# Patient Record
Sex: Female | Born: 1955
Health system: Southern US, Community
[De-identification: ages and names within clinical notes are randomized; demographics above are authoritative.]

## PROBLEM LIST (undated history)

## (undated) DIAGNOSIS — I10 Essential (primary) hypertension: Secondary | ICD-10-CM

## (undated) DIAGNOSIS — R0789 Other chest pain: Secondary | ICD-10-CM

## (undated) DIAGNOSIS — E119 Type 2 diabetes mellitus without complications: Secondary | ICD-10-CM

## (undated) DIAGNOSIS — E7800 Pure hypercholesterolemia, unspecified: Secondary | ICD-10-CM

## (undated) HISTORY — DX: Other chest pain: R07.89

---

## 2008-01-16 ENCOUNTER — Other Ambulatory Visit: Admission: RE | Admit: 2008-01-16 | Discharge: 2008-01-16 | Payer: Self-pay | Admitting: Family Medicine

## 2008-12-28 ENCOUNTER — Other Ambulatory Visit: Admission: RE | Admit: 2008-12-28 | Discharge: 2008-12-28 | Payer: Self-pay | Admitting: Family Medicine

## 2009-01-20 ENCOUNTER — Encounter: Payer: Self-pay | Admitting: Neurosurgery

## 2009-09-22 ENCOUNTER — Encounter: Admission: RE | Admit: 2009-09-22 | Discharge: 2009-09-22 | Payer: Self-pay | Admitting: Family Medicine

## 2010-01-19 ENCOUNTER — Ambulatory Visit: Payer: Self-pay | Admitting: Family Medicine

## 2010-01-19 ENCOUNTER — Other Ambulatory Visit: Admission: RE | Admit: 2010-01-19 | Discharge: 2010-01-19 | Payer: Self-pay | Admitting: Family Medicine

## 2010-01-19 DIAGNOSIS — E669 Obesity, unspecified: Secondary | ICD-10-CM | POA: Insufficient documentation

## 2010-01-19 DIAGNOSIS — M899 Disorder of bone, unspecified: Secondary | ICD-10-CM | POA: Insufficient documentation

## 2010-01-19 DIAGNOSIS — E785 Hyperlipidemia, unspecified: Secondary | ICD-10-CM | POA: Insufficient documentation

## 2010-01-19 DIAGNOSIS — F17200 Nicotine dependence, unspecified, uncomplicated: Secondary | ICD-10-CM | POA: Insufficient documentation

## 2010-01-19 DIAGNOSIS — M949 Disorder of cartilage, unspecified: Secondary | ICD-10-CM

## 2010-01-19 LAB — CONVERTED CEMR LAB
ALT: 23 units/L (ref 0–35)
BUN: 11 mg/dL (ref 6–23)
Basophils Absolute: 0 10*3/uL (ref 0.0–0.1)
Bilirubin Urine: NEGATIVE
CO2: 32 meq/L (ref 19–32)
Chloride: 111 meq/L (ref 96–112)
Cholesterol: 187 mg/dL (ref 0–200)
Eosinophils Relative: 2.4 % (ref 0.0–5.0)
Glucose, Bld: 104 mg/dL — ABNORMAL HIGH (ref 70–99)
Glucose, Urine, Semiquant: NEGATIVE
HCT: 41.2 % (ref 36.0–46.0)
Ketones, urine, test strip: NEGATIVE
Lymphs Abs: 2.3 10*3/uL (ref 0.7–4.0)
MCHC: 33.4 g/dL (ref 30.0–36.0)
MCV: 92.7 fL (ref 78.0–100.0)
Monocytes Absolute: 0.6 10*3/uL (ref 0.1–1.0)
Pap Smear: NEGATIVE
Platelets: 227 10*3/uL (ref 150.0–400.0)
Potassium: 4.3 meq/L (ref 3.5–5.1)
RDW: 12.3 % (ref 11.5–14.6)
TSH: 1.5 microintl units/mL (ref 0.35–5.50)
Total Bilirubin: 0.7 mg/dL (ref 0.3–1.2)
pH: 5

## 2010-02-01 ENCOUNTER — Encounter (INDEPENDENT_AMBULATORY_CARE_PROVIDER_SITE_OTHER): Payer: Self-pay | Admitting: *Deleted

## 2010-02-02 ENCOUNTER — Ambulatory Visit: Payer: Self-pay | Admitting: Internal Medicine

## 2010-06-27 ENCOUNTER — Emergency Department (HOSPITAL_BASED_OUTPATIENT_CLINIC_OR_DEPARTMENT_OTHER): Admission: EM | Admit: 2010-06-27 | Discharge: 2010-06-27 | Payer: Self-pay | Admitting: Emergency Medicine

## 2010-06-27 ENCOUNTER — Emergency Department (HOSPITAL_BASED_OUTPATIENT_CLINIC_OR_DEPARTMENT_OTHER): Admission: EM | Admit: 2010-06-27 | Discharge: 2010-06-28 | Payer: Self-pay | Admitting: Emergency Medicine

## 2010-06-28 ENCOUNTER — Ambulatory Visit: Payer: Self-pay | Admitting: Diagnostic Radiology

## 2010-09-11 ENCOUNTER — Encounter: Admission: RE | Admit: 2010-09-11 | Discharge: 2010-09-11 | Payer: Self-pay | Admitting: Neurosurgery

## 2010-12-03 ENCOUNTER — Encounter: Payer: Self-pay | Admitting: Family Medicine

## 2010-12-03 ENCOUNTER — Encounter: Payer: Self-pay | Admitting: Neurosurgery

## 2010-12-12 NOTE — Assessment & Plan Note (Signed)
Summary: BRAND NEW PT/TO EST/CJR   Vital Signs:  Patient profile:   55 year old female Height:      62.5 inches Weight:      174 pounds BMI:     31.43 Temp:     98.8 degrees F oral Pulse rate:   72 / minute Pulse rhythm:   regular BP sitting:   122 / 80  (left arm) Cuff size:   large  Vitals Entered By: Alfred Levins, CMA (January 19, 2010 2:46 PM) CC: npx with pap, not fasting   CC:  npx with pap and not fasting.  History of Present Illness: Debra Morrison is a 55 year old, married female, smoker, who comes in today as a new patient for evaluation of multiple issues.  She has a history of hyperlipidemia and has been on Lipitor.  We will switch her to Zocor for cost issues.  She smoked for many years.  I explained to her that I insists all my patients stop smoking.  I will put her on a smoking cessation program.  She is a history of osteoporosis and has been on Fosamax for 3 years.  However, the review of her dated shows she really has osteopenia, not osteoporosis.  Would recommend she stop the Fosamax, get a follow-up bone density in two years maintaining good bone health with exercise, calcium and vitamin D, and stop smoking.  She takes Vicodin for back pain.  She's been seen and evaluated by her neurosurgeon, Dr. Theresa Duty.  She's had two epidural steroid injections.  They think she ultimately may need to have surgery.  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  She is also overweight.  No history of spinal trauma.  She's requesting pain medication.  Referred to her neurosurgeon to prescribe medication   Preventive Screening-Counseling & Management  Alcohol-Tobacco     Smoking Status: current  Caffeine-Diet-Exercise     Does Patient Exercise: no      Drug Use:  no.    Current Medications (verified): 1)  Hydrocodone-Acetaminophen 5-500 Mg Tabs (Hydrocodone-Acetaminophen) .Marland Kitchen.. 1 By Mouth Every 6 Hours As Needed For Pain  Allergies (verified): No Known Drug Allergies  Past  History:  Past Medical History: childbirth x 2 Hyperlipidemia tobacco abuse osteopenia  Past Surgical History: Liposuction Face lift Rhinoplasty PMH-FH-SH reviewed-no changes except otherwise noted  Family History: Reviewed history and no changes required. Father Alzheimer's disease Mother Hypertension Maternal Grandmother  MI  Social History: Married Current Smoker Alcohol use-no Drug use-no Regular exercise-no Smoking Status:  current Drug Use:  no Does Patient Exercise:  no  Review of Systems      See HPI  Physical Exam  General:  Well-developed,well-nourished,in no acute distress; alert,appropriate and cooperative throughout examination Head:  Normocephalic and atraumatic without obvious abnormalities. No apparent alopecia or balding. Eyes:  No corneal or conjunctival inflammation noted. EOMI. Perrla. Funduscopic exam benign, without hemorrhages, exudates or papilledema. Vision grossly normal. Ears:  External ear exam shows no significant lesions or deformities.  Otoscopic examination reveals clear canals, tympanic membranes are intact bilaterally without bulging, retraction, inflammation or discharge. Hearing is grossly normal bilaterally. Nose:  External nasal examination shows no deformity or inflammation. Nasal mucosa are pink and moist without lesions or exudates. Mouth:  Oral mucosa and oropharynx without lesions or exudates.  Teeth in good repair. Neck:  No deformities, masses, or tenderness noted. Chest Wall:  No deformities, masses, or tenderness noted. Breasts:  No mass, nodules, thickening, tenderness, bulging, retraction, inflamation, nipple discharge or  skin changes noted.   Lungs:  Normal respiratory effort, chest expands symmetrically. Lungs are clear to auscultation, no crackles or wheezes. Heart:  Normal rate and regular rhythm. S1 and S2 normal without gallop, murmur, click, rub or other extra sounds. Abdomen:  Bowel sounds positive,abdomen soft and  non-tender without masses, organomegaly or hernias noted. Rectal:  No external abnormalities noted. Normal sphincter tone. No rectal masses or tenderness. Genitalia:  Pelvic Exam:        External: normal female genitalia without lesions or masses        Vagina: normal without lesions or masses        Cervix: normal without lesions or masses        Adnexa: normal bimanual exam without masses or fullness        Uterus: normal by palpation        Pap smear: performed Msk:  No deformity or scoliosis noted of thoracic or lumbar spine.   Pulses:  R and L carotid,radial,femoral,dorsalis pedis and posterior tibial pulses are full and equal bilaterally Extremities:  No clubbing, cyanosis, edema, or deformity noted with normal full range of motion of all joints.   Neurologic:  No cranial nerve deficits noted. Station and gait are normal. Plantar reflexes are down-going bilaterally. DTRs are symmetrical throughout. Sensory, motor and coordinative functions appear intact. Skin:  Intact without suspicious lesions or rashes Cervical Nodes:  No lymphadenopathy noted Axillary Nodes:  No palpable lymphadenopathy Inguinal Nodes:  No significant adenopathy Psych:  Cognition and judgment appear intact. Alert and cooperative with normal attention span and concentration. No apparent delusions, illusions, hallucinations   Problems:  Medical Problems Added: 1)  Dx of Preventive Health Care  (ICD-V70.0) 2)  Dx of Osteopenia  (ICD-733.90) 3)  Dx of Obesity  (ICD-278.00) 4)  Dx of Tobacco Use  (ICD-305.1) 5)  Dx of Hyperlipidemia  (ICD-272.4)  Impression & Recommendations:  Problem # 1:  HYPERLIPIDEMIA (ICD-272.4) Assessment New  Her updated medication list for this problem includes:    Zocor 40 Mg Tabs (Simvastatin) .Marland Kitchen... 1 tab @ bedtime  Orders: Venipuncture ([address]) TLB-Lipid Panel (80061-LIPID) TLB-BMP (Basic Metabolic Panel-BMET) (80048-METABOL) TLB-CBC Platelet - w/Differential  (85025-CBCD) TLB-Hepatic/Liver Function Pnl (80076-HEPATIC) TLB-TSH (Thyroid Stimulating Hormone) (84443-TSH)  Problem # 2:  OSTEOPENIA (ICD-733.90) Assessment: New  Orders: Venipuncture ([address]) TLB-Lipid Panel (80061-LIPID) TLB-BMP (Basic Metabolic Panel-BMET) (80048-METABOL) TLB-CBC Platelet - w/Differential (85025-CBCD) TLB-Hepatic/Liver Function Pnl (80076-HEPATIC) TLB-TSH (Thyroid Stimulating Hormone) (84443-TSH)  Problem # 3:  TOBACCO USE (ICD-305.1) Assessment: New  Her updated medication list for this problem includes:    Chantix Starting Month Pak 0.5 Mg X 11 & 1 Mg X 42 Tabs (Varenicline tartrate) ..... Uad  Problem # 4:  OBESITY (ICD-278.00) Assessment: New  Orders: Venipuncture ([address]11) TLB-Lipid Panel (80061-LIPID) TLB-BMP (Basic Metabolic Panel-BMET) (80048-METABOL) TLB-CBC Platelet - w/Differential (85025-CBCD) TLB-Hepatic/Liver Function Pnl (80076-HEPATIC) TLB-TSH (Thyroid Stimulating Hormone) (84443-TSH)  Complete Medication List: 1)  Hydrocodone-acetaminophen 5-500 Mg Tabs (Hydrocodone-acetaminophen) .Marland Kitchen.. 1 by mouth every 6 hours as needed for pain 2)  Zocor 40 Mg Tabs (Simvastatin) .Marland Kitchen.. 1 tab @ bedtime 3)  Chantix Starting Month Pak 0.5 Mg X 11 & 1 Mg X 42 Tabs (Varenicline tartrate) .... Uad  Other Orders: UA Dipstick w/o Micro (automated)  (81003) Gastroenterology Referral (GI)  Patient Instructions: 1)  switched from Lipitor to Zocor 40 mg nightly along with an 81 mg baby aspirin. 2)  Begin the smoking cessation program, and we outlined return in 4 weeks for follow-up. 3)  Stop  the Fosamax.  Take calcium, vitamin D, and exercise to maintain bone health follow-up.  Bone density in two years 4)  Schedule a colonoscopy/sigmoidoscopy to help detect colon cancer. 5)  Take calcium +Vitamin D daily. 6)  Take an Aspirin every day. Prescriptions: CHANTIX STARTING MONTH PAK 0.5 MG X 11 & 1 MG X 42 TABS (VARENICLINE TARTRATE) UAD  #1 x 0   Entered and  Authorized by:   Roderick Pee MD   Signed by:   Roderick Pee MD on 01/19/2010   Method used:   Print then Give to Patient   RxID:   [id number] ZOCOR 40 MG TABS (SIMVASTATIN) 1 tab @ bedtime  #100 x 3   Entered and Authorized by:   Roderick Pee MD   Signed by:   Roderick Pee MD on 01/19/2010   Method used:   Print then Give to Patient   RxID:   [id number]    Immunization History:  Tetanus/Td Immunization History:    Tetanus/Td:  historical (11/12/2006)   Laboratory Results   Urine Tests  Date/Time Recieved: January 20, 2010 11:51 AM  Date/Time Reported: January 20, 2010 11:51 AM   Routine Urinalysis   Color: yellow Appearance: Clear Glucose: negative   (Normal Range: Negative) Bilirubin: negative   (Normal Range: Negative) Ketone: negative   (Normal Range: Negative) Spec. Gravity: >=1.030   (Normal Range: 1.003-1.035) Blood: negative   (Normal Range: Negative) pH: 5.0   (Normal Range: 5.0-8.0) Protein: negative   (Normal Range: Negative) Urobilinogen: 0.2   (Normal Range: 0-1) Nitrite: negative   (Normal Range: Negative) Leukocyte Esterace: negative   (Normal Range: Negative)    Comments: Wynona Canes, CMA  January 20, 2010 11:51 AM

## 2010-12-12 NOTE — Letter (Signed)
Summary: [facility] Instructions  [facility]  [address] [place], [place] [address]   Phone: [phone]  Fax: [phone]       Debra Morrison    06/26/1956    MRN: [id number]       Procedure Day Debra Morrison:  Debra Morrison  Morrison     Arrival Time:  10:00AM     Procedure Time:  11:00AM     Location of Procedure:                    _ X_  [facility] (4th Floor)       PREPARATION FOR COLONOSCOPY WITH MIRALAX  Starting 5 days prior to your procedure 02/11/10 do not eat nuts, seeds, popcorn, corn, beans, peas,  salads, or any raw vegetables.  Do not take any fiber supplements (e.g. Metamucil, Citrucel, and Benefiber). ____________________________________________________________________________________________________   THE DAY BEFORE YOUR PROCEDURE         DATE:02/15/10  DAY: WEDNESDAY  1   Drink clear liquids the entire day-NO SOLID FOOD  2   Do not drink anything colored red or purple.  Avoid juices with pulp.  No orange juice.  3   Drink at least 64 oz. (8 glasses) of fluid/clear liquids during the day to prevent dehydration and help the prep work efficiently.  CLEAR LIQUIDS INCLUDE: Water Jello Ice Popsicles Tea (sugar ok, no milk/cream) Powdered fruit flavored drinks Coffee (sugar ok, no milk/cream) Gatorade Juice: apple, white grape, white cranberry  Lemonade Clear bullion, consomm, broth Carbonated beverages (any kind) Strained chicken noodle soup Hard Candy  4   Mix the entire bottle of Miralax with 64 oz. of Gatorade/Powerade in the morning and put in the refrigerator to chill.  5   At 3:00 pm take 2 Dulcolax/Bisacodyl tablets.  6   At 4:30 pm take one Reglan/Metoclopramide tablet.  7  Starting at 5:00 pm drink one 8 oz glass of the Miralax mixture every 15-20 minutes until you have finished drinking the entire 64 oz.  You should finish drinking prep around 7:30 or 8:00 pm.  8   If you are nauseated, you may take the 2nd  Reglan/Metoclopramide tablet at 6:30 pm.        9    At 8:00 pm take 2 more DULCOLAX/Bisacodyl tablets.     THE DAY OF YOUR PROCEDURE      DATE:  Morrison  DAY: Debra Morrison  You may drink clear liquids until 9:00AM  (2 HOURS BEFORE PROCEDURE).   MEDICATION INSTRUCTIONS  Unless otherwise instructed, you should take regular prescription medications with a small sip of water as early as possible the morning of your procedure.         OTHER INSTRUCTIONS  You will need a responsible adult at least 54 years of age to accompany you and drive you home.   This person must remain in the waiting room during your procedure.  Wear loose fitting clothing that is easily removed.  Leave jewelry and other valuables at home.  However, you may wish to bring a book to read or an iPod/MP3 player to listen to music as you wait for your procedure to start.  Remove all body piercing jewelry and leave at home.  Total time from sign-in until discharge is approximately 2-3 hours.  You should go home directly after your procedure and rest.  You can resume normal activities the day after your procedure.  The day of your procedure you should not:  Drive   Make legal decisions   Operate machinery   Drink alcohol   Return to work  You will receive specific instructions about eating, activities and medications before you leave.   The above instructions have been reviewed and explained to me by   Karl Bales RN  February 02, 2010 2:29 PM     I fully understand and can verbalize these instructions _____________________________ Date _______

## 2010-12-12 NOTE — Miscellaneous (Signed)
Summary: [facility] Previsit  Clinical Lists Changes  Medications: Added new medication of MIRALAX   POWD (POLYETHYLENE GLYCOL 3350) As per prep  instructions. - Signed Added new medication of DULCOLAX 5 MG  TBEC (BISACODYL) Day before procedure take 2 at 3pm and 2 at 8pm. - Signed Added new medication of REGLAN 10 MG  TABS (METOCLOPRAMIDE HCL) As per prep instructions. - Signed Rx of MIRALAX   POWD (POLYETHYLENE GLYCOL 3350) As per prep  instructions.;  #255gm x 0;  Signed;  Entered by: Karl Bales RN;  Authorized by: Hart Carwin MD;  Method used: Electronically to [id number] [address], [place], [place]  [address], Ph: [phone] or [phone], Fax: [phone] Rx of DULCOLAX 5 MG  TBEC (BISACODYL) Day before procedure take 2 at 3pm and 2 at 8pm.;  #4 x 0;  Signed;  Entered by: Karl Bales RN;  Authorized by: Hart Carwin MD;  Method used: Electronically to [facility] [address] [place], [place]  [address], Ph: [phone] or [phone], Fax: [phone] Rx of REGLAN 10 MG  TABS (METOCLOPRAMIDE HCL) As per prep instructions.;  #2 x 0;  Signed;  Entered by: Karl Bales RN;  Authorized by: Hart Carwin MD;  Method used: Electronically to [id number] [address] [place], [place]  [address], Ph: [phone] or [phone], Fax: [phone] Observations: Added new observation of NKA: T (02/02/2010 14:07)    Prescriptions: REGLAN 10 MG  TABS (METOCLOPRAMIDE HCL) As per prep instructions.  #2 x 0   Entered by:   Karl Bales RN   Authorized by:   Hart Carwin MD   Signed by:   Karl Bales RN on 02/02/2010   Method used:   Electronically to        [facility] (retail)       [address]       [place], [place]  [address]       Ph: [phone] or [phone]       Fax: [phone]   RxID:   [id number] DULCOLAX 5 MG  TBEC (BISACODYL) Day before procedure take 2 at 3pm and 2 at 8pm.  #4 x 0   Entered by:   Karl Bales RN  Authorized by:   Hart Carwin MD   Signed by:   Karl Bales RN on 02/02/2010   Method used:   Electronically to        [facility] (retail)       [address]       [place], [place]  [address]       Ph: [phone] or [phone]       Fax: [phone]   RxID:   [id number] MIRALAX   POWD (POLYETHYLENE GLYCOL 3350) As per prep  instructions.  #255gm x 0   Entered by:   Karl Bales RN   Authorized by:   Hart Carwin MD   Signed by:   Karl Bales RN on 02/02/2010   Method used:   Electronically to        [facility] (retail)       [address]       [place], [place]  [address]       Ph: [phone] or [phone]       Fax: [phone]   RxID:   [id number]

## 2011-01-26 LAB — POCT CARDIAC MARKERS
CKMB, poc: 1 ng/mL (ref 1.0–8.0)
CKMB, poc: 1.3 ng/mL (ref 1.0–8.0)
Myoglobin, poc: 46.9 ng/mL (ref 12–200)
Myoglobin, poc: 48.4 ng/mL (ref 12–200)
Troponin i, poc: <0.05 ng/mL (ref 0.00–0.09)

## 2011-01-26 LAB — URINALYSIS, ROUTINE W REFLEX MICROSCOPIC
Glucose, UA: NEGATIVE mg/dL
Ketones, ur: NEGATIVE mg/dL
Nitrite: NEGATIVE
pH: 7 (ref 5.0–8.0)

## 2011-01-26 LAB — DIFFERENTIAL
Basophils Absolute: 0.1 10*3/uL (ref 0.0–0.1)
Lymphocytes Relative: 25 % (ref 12–46)
Neutro Abs: 6.2 10*3/uL (ref 1.7–7.7)

## 2011-01-26 LAB — CBC
HCT: 42 % (ref 36.0–46.0)
Platelets: 246 10*3/uL (ref 150–400)
RDW: 12.5 % (ref 11.5–15.5)
WBC: 10 10*3/uL (ref 4.0–10.5)

## 2011-01-26 LAB — BASIC METABOLIC PANEL
BUN: 17 mg/dL (ref 6–23)
GFR calc non Af Amer: >60 mL/min (ref >60)
Potassium: 3.9 mEq/L (ref 3.5–5.1)

## 2011-01-26 LAB — D-DIMER, QUANTITATIVE: D-Dimer, Quant: <0.22 ug/mL-FEU (ref 0.00–0.48)

## 2011-10-25 ENCOUNTER — Other Ambulatory Visit (HOSPITAL_COMMUNITY)
Admission: RE | Admit: 2011-10-25 | Discharge: 2011-10-25 | Disposition: A | Discharge status: Home / Self Care | Payer: Medicare Other | Source: Ambulatory Visit | Attending: Family Medicine | Admitting: Family Medicine

## 2011-10-25 ENCOUNTER — Other Ambulatory Visit: Payer: Self-pay | Admitting: Family Medicine

## 2011-10-25 DIAGNOSIS — Z124 Encounter for screening for malignant neoplasm of cervix: Secondary | ICD-10-CM | POA: Insufficient documentation

## 2011-10-25 DIAGNOSIS — Z1159 Encounter for screening for other viral diseases: Secondary | ICD-10-CM | POA: Insufficient documentation

## 2012-01-17 DIAGNOSIS — Z1231 Encounter for screening mammogram for malignant neoplasm of breast: Secondary | ICD-10-CM | POA: Diagnosis not present

## 2012-01-17 DIAGNOSIS — E559 Vitamin D deficiency, unspecified: Secondary | ICD-10-CM | POA: Diagnosis not present

## 2012-01-17 DIAGNOSIS — E785 Hyperlipidemia, unspecified: Secondary | ICD-10-CM | POA: Diagnosis not present

## 2012-01-25 DIAGNOSIS — M722 Plantar fascial fibromatosis: Secondary | ICD-10-CM | POA: Diagnosis not present

## 2012-08-21 DIAGNOSIS — E559 Vitamin D deficiency, unspecified: Secondary | ICD-10-CM | POA: Diagnosis not present

## 2012-09-15 DIAGNOSIS — J309 Allergic rhinitis, unspecified: Secondary | ICD-10-CM | POA: Diagnosis not present

## 2012-09-15 DIAGNOSIS — R079 Chest pain, unspecified: Secondary | ICD-10-CM | POA: Diagnosis not present

## 2012-09-24 DIAGNOSIS — R0602 Shortness of breath: Secondary | ICD-10-CM | POA: Diagnosis not present

## 2012-09-24 DIAGNOSIS — I10 Essential (primary) hypertension: Secondary | ICD-10-CM | POA: Diagnosis not present

## 2012-09-24 DIAGNOSIS — F17200 Nicotine dependence, unspecified, uncomplicated: Secondary | ICD-10-CM | POA: Diagnosis not present

## 2012-09-24 DIAGNOSIS — R079 Chest pain, unspecified: Secondary | ICD-10-CM | POA: Diagnosis not present

## 2012-10-01 DIAGNOSIS — R0602 Shortness of breath: Secondary | ICD-10-CM | POA: Diagnosis not present

## 2012-10-01 DIAGNOSIS — R079 Chest pain, unspecified: Secondary | ICD-10-CM | POA: Diagnosis not present

## 2012-10-01 DIAGNOSIS — I10 Essential (primary) hypertension: Secondary | ICD-10-CM | POA: Diagnosis not present

## 2012-10-01 DIAGNOSIS — F17200 Nicotine dependence, unspecified, uncomplicated: Secondary | ICD-10-CM | POA: Diagnosis not present

## 2013-01-01 DIAGNOSIS — M545 Low back pain, unspecified: Secondary | ICD-10-CM | POA: Diagnosis not present

## 2013-01-01 DIAGNOSIS — N951 Menopausal and female climacteric states: Secondary | ICD-10-CM | POA: Diagnosis not present

## 2013-01-01 DIAGNOSIS — E559 Vitamin D deficiency, unspecified: Secondary | ICD-10-CM | POA: Diagnosis not present

## 2013-01-01 DIAGNOSIS — Z0000 Encounter for general adult medical examination without abnormal findings: Secondary | ICD-10-CM | POA: Diagnosis not present

## 2013-01-01 DIAGNOSIS — Z79899 Other long term (current) drug therapy: Secondary | ICD-10-CM | POA: Diagnosis not present

## 2013-01-01 DIAGNOSIS — IMO0002 Reserved for concepts with insufficient information to code with codable children: Secondary | ICD-10-CM | POA: Diagnosis not present

## 2013-01-01 DIAGNOSIS — E7800 Pure hypercholesterolemia, unspecified: Secondary | ICD-10-CM | POA: Diagnosis not present

## 2013-01-01 DIAGNOSIS — F17200 Nicotine dependence, unspecified, uncomplicated: Secondary | ICD-10-CM | POA: Diagnosis not present

## 2013-06-18 DIAGNOSIS — J069 Acute upper respiratory infection, unspecified: Secondary | ICD-10-CM | POA: Diagnosis not present

## 2013-06-18 DIAGNOSIS — M654 Radial styloid tenosynovitis [de Quervain]: Secondary | ICD-10-CM | POA: Diagnosis not present

## 2013-07-06 DIAGNOSIS — M5106 Intervertebral disc disorders with myelopathy, lumbar region: Secondary | ICD-10-CM | POA: Diagnosis not present

## 2013-07-06 DIAGNOSIS — M654 Radial styloid tenosynovitis [de Quervain]: Secondary | ICD-10-CM | POA: Diagnosis not present

## 2013-10-13 DIAGNOSIS — M654 Radial styloid tenosynovitis [de Quervain]: Secondary | ICD-10-CM | POA: Diagnosis not present

## 2013-11-24 ENCOUNTER — Emergency Department (HOSPITAL_COMMUNITY)
Admission: EM | Admit: 2013-11-24 | Discharge: 2013-11-24 | Disposition: A | Discharge status: Home / Self Care | Payer: Medicare Other | Attending: Emergency Medicine | Admitting: Emergency Medicine

## 2013-11-24 ENCOUNTER — Emergency Department (HOSPITAL_COMMUNITY): Payer: Medicare Other

## 2013-11-24 ENCOUNTER — Encounter (HOSPITAL_COMMUNITY): Payer: Self-pay | Admitting: Emergency Medicine

## 2013-11-24 DIAGNOSIS — R6889 Other general symptoms and signs: Secondary | ICD-10-CM

## 2013-11-24 DIAGNOSIS — F17200 Nicotine dependence, unspecified, uncomplicated: Secondary | ICD-10-CM | POA: Insufficient documentation

## 2013-11-24 DIAGNOSIS — J111 Influenza due to unidentified influenza virus with other respiratory manifestations: Secondary | ICD-10-CM | POA: Insufficient documentation

## 2013-11-24 DIAGNOSIS — R059 Cough, unspecified: Secondary | ICD-10-CM | POA: Diagnosis not present

## 2013-11-24 DIAGNOSIS — R05 Cough: Secondary | ICD-10-CM | POA: Diagnosis not present

## 2013-11-24 DIAGNOSIS — R062 Wheezing: Secondary | ICD-10-CM | POA: Diagnosis not present

## 2013-11-24 DIAGNOSIS — Z792 Long term (current) use of antibiotics: Secondary | ICD-10-CM | POA: Diagnosis not present

## 2013-11-24 MED ORDER — IBUPROFEN 800 MG PO TABS: 800.0000 mg | ORAL_TABLET | Freq: Three times a day (TID) | ORAL | Status: DC

## 2013-11-24 MED ORDER — HYDROCOD POLST-CHLORPHEN POLST 10-8 MG/5ML PO LQCR
5.0000 mL | Freq: Once | ORAL | Status: AC
  Administered 2013-11-24: 5 mL via ORAL
  Filled 2013-11-24: qty 5

## 2013-11-24 MED ORDER — PROMETHAZINE-DM 6.25-15 MG/5ML PO SYRP: 5.0000 mL | ORAL_SOLUTION | Freq: Four times a day (QID) | ORAL | Status: DC | PRN

## 2013-11-24 MED ORDER — PSEUDOEPHEDRINE HCL 30 MG PO TABS: 30.0000 mg | ORAL_TABLET | ORAL | Status: DC | PRN

## 2013-11-24 MED ORDER — ALBUTEROL SULFATE HFA 108 (90 BASE) MCG/ACT IN AERS
2.0000 | INHALATION_SPRAY | Freq: Once | RESPIRATORY_TRACT | Status: AC
  Administered 2013-11-24: 2 via RESPIRATORY_TRACT
  Filled 2013-11-24: qty 6.7

## 2013-11-24 MED ORDER — IBUPROFEN 800 MG PO TABS
800.0000 mg | ORAL_TABLET | Freq: Once | ORAL | Status: AC
  Administered 2013-11-24: 800 mg via ORAL
  Filled 2013-11-24: qty 1

## 2013-11-24 NOTE — Discharge Instructions (Signed)
Take ibuprofen regularly to keep your temperature down. You can continue dayquil and Nyquil as long as they do not contain dextromethorphan. Take promathazine DM for cough, this medication will make you drowsy and help you sleep. Take inhaler 2 puffs every 4 hrs.  Follow up with primary care doctor.    Influenza A (H1N1) H1N1 formerly called "swine flu" is a new influenza virus causing sickness in people. The H1N1 virus is different from seasonal influenza viruses. However, the H1N1 symptoms are similar to seasonal influenza and it is spread from person to person. You may be at higher risk for serious problems if you have underlying serious medical conditions. The CDC and the [facility] are following reported cases around the world. CAUSES   The flu is thought to spread mainly person-to-person through coughing or sneezing of infected people.  A person may become infected by touching something with the virus on it and then touching their mouth or nose. SYMPTOMS   Fever.  Headache.  Tiredness.  Cough.  Sore throat.  Runny or stuffy nose.  Body aches.  Diarrhea and vomiting These symptoms are referred to as "flu-like symptoms." A lot of different illnesses, including the common cold, may have similar symptoms. DIAGNOSIS   There are tests that can tell if you have the H1N1 virus.  Confirmed cases of H1N1 will be reported to the state or local health department.  A doctor's exam may be needed to tell whether you have an infection that is a complication of the flu. HOME CARE INSTRUCTIONS   Stay informed. Visit the [facility] website for current recommendations. Visit [employer]. You may also call 1-800-CDC-INFO 2013963715).  Get help early if you develop any of the above symptoms.  If you are at high risk from complications of the flu, talk to your caregiver as soon as you develop flu-like symptoms. Those at higher risk for complications include:  People 65  years or older.  People with chronic medical conditions.  Pregnant women.  Young children.  Your caregiver may recommend antiviral medicine to help treat the flu.  If you get the flu, get plenty of rest, drink enough water and fluids to keep your urine clear or pale yellow, and avoid using alcohol or tobacco.  You may take over-the-counter medicine to relieve the symptoms of the flu if your caregiver approves. (Never give aspirin to children or teenagers who have flu-like symptoms, particularly fever). TREATMENT  If you do get sick, antiviral drugs are available. These drugs can make your illness milder and make you feel better faster. Treatment should start soon after illness starts. It is only effective if taken within the first day of becoming ill. Only your caregiver can prescribe antiviral medication.  PREVENTION   Cover your nose and mouth with a tissue or your arm when you cough or sneeze. Throw the tissue away.  Wash your hands often with soap and warm water, especially after you cough or sneeze. Alcohol-based cleaners are also effective against germs.  Avoid touching your eyes, nose or mouth. This is one way germs spread.  Try to avoid contact with sick people. Follow public health advice regarding school closures. Avoid crowds.  Stay home if you get sick. Limit contact with others to keep from infecting them. People infected with the H1N1 virus may be able to infect others anywhere from 1 day before feeling sick to 5-7 days after getting flu symptoms.  An H1N1 vaccine is available to help protect against the virus.  In addition to the H1N1 vaccine, you will need to be vaccinated for seasonal influenza. The H1N1 and seasonal vaccines may be given on the same day. The CDC especially recommends the H1N1 vaccine for:  Pregnant women.  People who live with or care for children younger than 32 months of age.  Health care and emergency services personnel.  Persons between the ages  of 22 months through 65 years of age.  People from ages 37 through 52 years who are at higher risk for H1N1 because of chronic health disorders or immune system problems. FACEMASKS In community and home settings, the use of facemasks and N95 respirators are not normally recommended. In certain circumstances, a facemask or N95 respirator may be used for persons at increased risk of severe illness from influenza. Your caregiver can give additional recommendations for facemask use. IN CHILDREN, EMERGENCY WARNING SIGNS THAT NEED URGENT MEDICAL CARE:  Fast breathing or trouble breathing.  Bluish skin color.  Not drinking enough fluids.  Not waking up or not interacting normally.  Being so fussy that the child does not want to be held.  Your child has an oral temperature above 102 F (38.9 C), not controlled by medicine.  Your baby is older than 3 months with a rectal temperature of 102 F (38.9 C) or higher.  Your baby is 69 months old or younger with a rectal temperature of 100.4 F (38 C) or higher.  Flu-like symptoms improve but then return with fever and worse cough. IN ADULTS, EMERGENCY WARNING SIGNS THAT NEED URGENT MEDICAL CARE:  Difficulty breathing or shortness of breath.  Pain or pressure in the chest or abdomen.  Sudden dizziness.  Confusion.  Severe or persistent vomiting.  Bluish color.  You have a oral temperature above 102 F (38.9 C), not controlled by medicine.  Flu-like symptoms improve but return with fever and worse cough. SEEK IMMEDIATE MEDICAL CARE IF:  You or someone you know is experiencing any of the above symptoms. When you arrive at the emergency center, report that you think you have the flu. You may be asked to wear a mask and/or sit in a secluded area to protect others from getting sick. MAKE SURE YOU:   Understand these instructions.  Will watch your condition.  Will get help right away if you are not doing well or get worse. Some of this  information courtesy of the CDC.  Document Released: 04/16/2008 Document Revised: 01/21/2012 Document Reviewed: 04/16/2008 [facility] Patient Information 2014 [place], [place].

## 2013-11-24 NOTE — ED Provider Notes (Signed)
CSN: [id number]     Arrival date & time 11/24/13  1403 History   First MD Initiated Contact with Patient 11/24/13 1539     Chief Complaint  Patient presents with  . Cough  . Fever  . Generalized Body Aches   (Consider location/radiation/quality/duration/timing/severity/associated sxs/prior Treatment) HPI Debra Morrison is a 58 y.o. female who presents to emergency department complaining of fever, body aches, chills, sore throat, congestion, cough. Symptoms began 4 days ago. She's been taking over-the-counter indications for her symptoms with no relief. Patient also states she took several tablets of Zithromax that she has had left over since August. She states they're not helping either. Patient states that her husband is at home with similar symptoms. Patient did not receive her flu vaccination this year. Patient denies any chest pain or shortness of breath. Patient denies any nausea, vomiting, diarrhea, abdominal pain. She denies any urinary symptoms.  History reviewed. No pertinent past medical history. No past surgical history on file. No family history on file. History  Substance Use Topics  . Smoking status: Current Every Day Smoker -- 0.50 packs/day    Types: Cigarettes  . Smokeless tobacco: Not on file  . Alcohol Use: No   OB History   Grav Para Term Preterm Abortions TAB SAB Ect Mult Living                 Review of Systems  Constitutional: Positive for fever and chills.  HENT: Positive for congestion and sore throat. Negative for ear pain.   Respiratory: Positive for cough. Negative for chest tightness and shortness of breath.   Cardiovascular: Negative for chest pain, palpitations and leg swelling.  Gastrointestinal: Negative for nausea, vomiting, abdominal pain and diarrhea.  Genitourinary: Negative for dysuria, flank pain, vaginal bleeding, vaginal discharge, vaginal pain and pelvic pain.  Musculoskeletal: Positive for myalgias. Negative for arthralgias, neck pain and  neck stiffness.  Skin: Negative for rash.  Neurological: Negative for dizziness, weakness and headaches.  All other systems reviewed and are negative.    Allergies  Boniva  Home Medications   Current Outpatient Rx  Name  Route  Sig  Dispense  Refill  . azithromycin (ZITHROMAX) 250 MG tablet   Oral   Take 250 mg by mouth daily. Take two tablets first day and then 250mg  daily till finished five day supply. Started on 11/22/13          BP 121/76  Pulse 94  Temp(Src) 98.3 F (36.8 C) (Oral)  Resp 18  SpO2 99% Physical Exam  Nursing note and vitals reviewed. Constitutional: She appears well-developed and well-nourished. No distress.  HENT:  Head: Normocephalic.  Right Ear: External ear normal.  Left Ear: External ear normal.  Nasal congestion. Oropharynx erythematous. Tonsils normal. Uvula midline.   Eyes: Conjunctivae are normal.  Neck: Neck supple.  Cardiovascular: Normal rate, regular rhythm and normal heart sounds.   Pulmonary/Chest: Effort normal. No respiratory distress. She has wheezes. She has no rales.  Diffuse wheezes with expirations. coughing  Abdominal: Soft. Bowel sounds are normal. She exhibits no distension. There is no tenderness. There is no rebound.  Musculoskeletal: She exhibits no edema.  Neurological: She is alert.  Skin: Skin is warm and dry.  Psychiatric: She has a normal mood and affect. Her behavior is normal.    ED Course  Procedures (including critical care time) Labs Review Labs Reviewed - No data to display Imaging Review Dg Chest 2 View  11/24/2013   CLINICAL DATA:  Cough.  Fever.  EXAM: CHEST  2 VIEW  COMPARISON:  06/27/2010  FINDINGS: The heart size and mediastinal contours are within normal limits. Both lungs are clear. The visualized skeletal structures are unremarkable.  IMPRESSION: No active cardiopulmonary disease.   Electronically Signed   By: Lajean Manes M.D.   On: 11/24/2013 16:21    EKG Interpretation   None       MDM    1. Flu-like symptoms     Pt with Flu like symptoms for 4 days. Worsening. Husband sick at home with the same. Pt febrile in er at 100.3. She has sore throat, nasal congestion, cough. CXR was obtained and is negative. Pt was treated with ibuprofen 800mg  for her fever, inhaler for wheezing, which she took home, and tussionex for cough. Will discharge home with cough medication, ibuprofen, sudafed. Follow up with pcp advised.   Filed Vitals:   11/24/13 1427 11/24/13 1622  BP: 121/76   Pulse: 94   Temp: 98.3 F (36.8 C) 100.3 F (37.9 C)  TempSrc: Oral Oral  Resp: 18   SpO2: 99%        Chetara Kropp A Jowanda Heeg, PA-C 11/24/13 2100

## 2013-11-24 NOTE — ED Notes (Signed)
Pt c/o fever, cough, body aches x 4 days.

## 2013-11-25 NOTE — ED Provider Notes (Signed)
Medical screening examination/treatment/procedure(s) were conducted as a shared visit with non-physician practitioner(s) and myself.  I personally evaluated the patient during the encounter.  EKG Interpretation   None       Pt c/o non prod cough, body aches, nasal congestion. Chest cta. Non prod cough. cxr.  Mirna Mires, MD 11/25/13 [phone]

## 2014-01-04 ENCOUNTER — Emergency Department (HOSPITAL_COMMUNITY)
Admission: EM | Admit: 2014-01-04 | Discharge: 2014-01-04 | Disposition: A | Discharge status: Home / Self Care | Payer: Medicare Other | Attending: Emergency Medicine | Admitting: Emergency Medicine

## 2014-01-04 ENCOUNTER — Emergency Department (HOSPITAL_COMMUNITY): Payer: Medicare Other

## 2014-01-04 ENCOUNTER — Encounter (HOSPITAL_COMMUNITY): Payer: Self-pay | Admitting: Emergency Medicine

## 2014-01-04 DIAGNOSIS — R112 Nausea with vomiting, unspecified: Secondary | ICD-10-CM | POA: Diagnosis not present

## 2014-01-04 DIAGNOSIS — Z8639 Personal history of other endocrine, nutritional and metabolic disease: Secondary | ICD-10-CM | POA: Insufficient documentation

## 2014-01-04 DIAGNOSIS — Z862 Personal history of diseases of the blood and blood-forming organs and certain disorders involving the immune mechanism: Secondary | ICD-10-CM | POA: Diagnosis not present

## 2014-01-04 DIAGNOSIS — R1013 Epigastric pain: Secondary | ICD-10-CM

## 2014-01-04 DIAGNOSIS — N289 Disorder of kidney and ureter, unspecified: Secondary | ICD-10-CM | POA: Diagnosis not present

## 2014-01-04 DIAGNOSIS — N281 Cyst of kidney, acquired: Secondary | ICD-10-CM

## 2014-01-04 DIAGNOSIS — R109 Unspecified abdominal pain: Secondary | ICD-10-CM | POA: Diagnosis not present

## 2014-01-04 DIAGNOSIS — F17200 Nicotine dependence, unspecified, uncomplicated: Secondary | ICD-10-CM | POA: Insufficient documentation

## 2014-01-04 DIAGNOSIS — Q619 Cystic kidney disease, unspecified: Secondary | ICD-10-CM | POA: Diagnosis not present

## 2014-01-04 HISTORY — DX: Pure hypercholesterolemia, unspecified: E78.00

## 2014-01-04 LAB — URINALYSIS, ROUTINE W REFLEX MICROSCOPIC
Bilirubin Urine: NEGATIVE
Glucose, UA: NEGATIVE mg/dL
HGB URINE DIPSTICK: NEGATIVE
Ketones, ur: NEGATIVE mg/dL
LEUKOCYTES UA: NEGATIVE
NITRITE: NEGATIVE
PROTEIN: NEGATIVE mg/dL
SPECIFIC GRAVITY, URINE: 1.008 (ref 1.005–1.030)
UROBILINOGEN UA: 0.2 mg/dL (ref 0.0–1.0)
pH: 7 (ref 5.0–8.0)

## 2014-01-04 LAB — COMPREHENSIVE METABOLIC PANEL
ALT: 22 U/L (ref 0–35)
AST: 18 U/L (ref 0–37)
Albumin: 4.1 g/dL (ref 3.5–5.2)
Alkaline Phosphatase: 68 U/L (ref 39–117)
BUN: 15 mg/dL (ref 6–23)
CALCIUM: 9.7 mg/dL (ref 8.4–10.5)
CO2: 23 meq/L (ref 19–32)
Chloride: 102 mEq/L (ref 96–112)
Creatinine, Ser: 0.61 mg/dL (ref 0.50–1.10)
Glucose, Bld: 125 mg/dL — ABNORMAL HIGH (ref 70–99)
Potassium: 3.9 mEq/L (ref 3.7–5.3)
SODIUM: 140 meq/L (ref 137–147)
TOTAL PROTEIN: 7.3 g/dL (ref 6.0–8.3)
Total Bilirubin: 0.4 mg/dL (ref 0.3–1.2)

## 2014-01-04 LAB — CBC
HCT: 44 % (ref 36.0–46.0)
Hemoglobin: 14.9 g/dL (ref 12.0–15.0)
MCH: 30.3 pg (ref 26.0–34.0)
MCHC: 33.9 g/dL (ref 30.0–36.0)
MCV: 89.4 fL (ref 78.0–100.0)
Platelets: 223 10*3/uL (ref 150–400)
RBC: 4.92 MIL/uL (ref 3.87–5.11)
RDW: 12.8 % (ref 11.5–15.5)
WBC: 9.2 10*3/uL (ref 4.0–10.5)

## 2014-01-04 LAB — LIPASE, BLOOD: Lipase: 36 U/L (ref 11–59)

## 2014-01-04 MED ORDER — GI COCKTAIL ~~LOC~~
30.0000 mL | Freq: Once | ORAL | Status: AC
  Administered 2014-01-04: 30 mL via ORAL
  Filled 2014-01-04: qty 30

## 2014-01-04 MED ORDER — HYDROMORPHONE HCL PF 1 MG/ML IJ SOLN
0.5000 mg | Freq: Once | INTRAMUSCULAR | Status: AC
  Administered 2014-01-04: 0.5 mg via INTRAVENOUS

## 2014-01-04 MED ORDER — FAMOTIDINE IN NACL 20-0.9 MG/50ML-% IV SOLN
20.0000 mg | Freq: Once | INTRAVENOUS | Status: AC
  Administered 2014-01-04: 20 mg via INTRAVENOUS
  Filled 2014-01-04: qty 50

## 2014-01-04 MED ORDER — ESOMEPRAZOLE MAGNESIUM 40 MG PO CPDR: 40.0000 mg | DELAYED_RELEASE_CAPSULE | Freq: Every day | ORAL | Status: DC

## 2014-01-04 MED ORDER — ACETAMINOPHEN 325 MG PO TABS
650.0000 mg | ORAL_TABLET | Freq: Once | ORAL | Status: AC
  Administered 2014-01-04: 650 mg via ORAL
  Filled 2014-01-04: qty 2

## 2014-01-04 MED ORDER — FAMOTIDINE 20 MG PO TABS: 20.0000 mg | ORAL_TABLET | Freq: Two times a day (BID) | ORAL | Status: DC | PRN

## 2014-01-04 MED ORDER — HYDROMORPHONE HCL PF 1 MG/ML IJ SOLN
INTRAMUSCULAR | Status: AC
  Filled 2014-01-04: qty 1

## 2014-01-04 NOTE — Discharge Instructions (Signed)
Take pepcid twice a day as needed for nausea.   Avoid taking motrin or alleve.   Take nexium daily.   Follow up with a GI doctor for endoscopy.   You have a mass above your right kidney that needs follow up.   Return to ER if you have vomiting, severe pain, dehydration.

## 2014-01-04 NOTE — ED Notes (Signed)
Pt states she is having abd pain that started yesterday  Points to her midepigastric area describes as something stuck in there  Pt states she tried to make herself vomit but it did not help

## 2014-01-04 NOTE — ED Provider Notes (Addendum)
CSN: [id number]     Arrival date & time 01/04/14  0423 History   First MD Initiated Contact with Patient 01/04/14 [phone]     Chief Complaint  Patient presents with  . Abdominal Pain      The history is provided by the patient.   patient presents emergency department because of some upper abdominal pain that began yesterday.  She reports the pain is located in her epigastric region.  Her pain began shortly after taking 2 anti-inflammatory pain pills.  Splenule to keep fluids down.  She's able tolerate her secretions.  She's never had problems such as this before.  She reports her pain is located in the upper abdomen and that her pain seems to be waxing and waning.  Her pain has been associated with nausea and vomiting.  No diarrhea.  No fevers or chills.  No prior history of gallstones.  She's never had abdominal surgery.  She smokes cigarettes.    Past Medical History  Diagnosis Date  . High cholesterol    History reviewed. No pertinent past surgical history. Family History  Problem Relation Age of Onset  . Hypertension Mother    History  Substance Use Topics  . Smoking status: Current Every Day Smoker -- 0.50 packs/day    Types: Cigarettes  . Smokeless tobacco: Not on file  . Alcohol Use: Yes     Comment: occ   OB History   Grav Para Term Preterm Abortions TAB SAB Ect Mult Living                 Review of Systems  Gastrointestinal: Positive for abdominal pain.  All other systems reviewed and are negative.      Allergies  Boniva  Home Medications   Current Outpatient Rx  Name  Route  Sig  Dispense  Refill  . amoxicillin (AMOXIL) 500 MG capsule   Oral   Take 500 mg by mouth 3 (three) times daily. 10 day course starting 2/17         . Pseudoeph-Doxylamine-DM-APAP (NYQUIL PO)   Oral   Take 1-2 capsules by mouth at bedtime as needed (cough, cold symptoms).          . Pseudoephedrine-APAP-DM (DAYQUIL PO)   Oral   Take 1-2 capsules by mouth 2 (two) times daily  as needed (cough, cold symptoms).           BP 184/84  Pulse 63  Temp(Src) 97.5 F (36.4 C) (Oral)  Resp 18  Ht 5\' 6"  (1.676 m)  Wt 170 lb (77.111 kg)  BMI 27.45 kg/m2  SpO2 94% Physical Exam  Nursing note and vitals reviewed. Constitutional: She is oriented to person, place, and time. She appears well-developed and well-nourished. No distress.  HENT:  Head: Normocephalic and atraumatic.  Eyes: EOM are normal.  Neck: Normal range of motion.  Cardiovascular: Normal rate, regular rhythm and normal heart sounds.   Pulmonary/Chest: Effort normal and breath sounds normal.  Abdominal: Soft. She exhibits no distension.  Mild epigastric tenderness.  No guarding or rebound.  Musculoskeletal: Normal range of motion.  Neurological: She is alert and oriented to person, place, and time.  Skin: Skin is warm and dry.  Psychiatric: She has a normal mood and affect. Judgment normal.    ED Course  Procedures (including critical care time) Labs Review Labs Reviewed  COMPREHENSIVE METABOLIC PANEL - Abnormal; Notable for the following:    Glucose, Bld 125 (*)    All other components within normal  limits  CBC  LIPASE, BLOOD   Imaging Review Dg Abd Acute W/chest  01/04/2014   CLINICAL DATA:  Upper abdominal pain and nausea.  EXAM: ACUTE ABDOMEN SERIES (ABDOMEN 2 VIEW & CHEST 1 VIEW)  COMPARISON:  Chest radiograph performed 11/24/2013  FINDINGS: The lungs are well-aerated and clear. There is no evidence of focal opacification, pleural effusion or pneumothorax. The cardiomediastinal silhouette is within normal limits.  The visualized bowel gas pattern is unremarkable. Scattered stool and air are seen within the colon; there is no evidence of small bowel dilatation to suggest obstruction. No free intra-abdominal air is identified on the provided upright view.  No acute osseous abnormalities are seen; the sacroiliac joints are unremarkable in appearance.  IMPRESSION: 1. Unremarkable bowel gas  pattern; no free intra-abdominal air seen. Moderate stool burden noted. 2. No acute cardiopulmonary process identified.   Electronically Signed   By: Garald Balding M.D.   On: 01/04/2014 06:45  I personally reviewed the imaging tests through PACS system I reviewed available ER/hospitalization records through the EMR   EKG Interpretation    Date/Time:  Monday January 04 2014 04:53:18 EST Ventricular Rate:  60 PR Interval:  133 QRS Duration: 96 QT Interval:  459 QTC Calculation: 459 R Axis:   66 Text Interpretation:  Sinus rhythm No significant change was found Confirmed by Sebrena Engh  MD, Stayce Delancy ([id number]) on 01/04/2014 7:27:36 AM            MDM   Final diagnoses:  None    Mild epigastric discomfort.  She's tolerating fluids at this time.  I do not believe that she has esophageal impaction.  This could represent biliary colic.  Ultrasound pending.  LFTs and lipase without abnormality.  Pain will be treated.  Care to Dr. Vonzella Nipple, MD 01/04/14 [place], MD 01/04/14 [phone]

## 2014-01-04 NOTE — ED Notes (Signed)
Patient transported to X-ray 

## 2014-01-04 NOTE — ED Notes (Signed)
MD at bedside. 

## 2014-01-04 NOTE — ED Provider Notes (Signed)
Care assumed at sign out. Patient had some epigastric pain after taking NSAIDs. Sign out pending [place]. US showed enlarged R adrenal gland but gallbladder was normal. I think she likely has reflux vs PUD from NSAID use. Recommend nexium, prn pepcid, GI f/u.   Results for orders placed during the hospital encounter of 01/04/14  CBC      Result Value Ref Range   WBC 9.2  4.0 - 10.5 K/uL   RBC 4.92  3.87 - 5.11 MIL/uL   Hemoglobin 14.9  12.0 - 15.0 g/dL   HCT 44.0  36.0 - 46.0 %   MCV 89.4  78.0 - 100.0 fL   MCH 30.3  26.0 - 34.0 pg   MCHC 33.9  30.0 - 36.0 g/dL   RDW 12.8  11.5 - 15.5 %   Platelets 223  150 - 400 K/uL  COMPREHENSIVE METABOLIC PANEL      Result Value Ref Range   Sodium 140  137 - 147 mEq/L   Potassium 3.9  3.7 - 5.3 mEq/L   Chloride 102  96 - 112 mEq/L   CO2 23  19 - 32 mEq/L   Glucose, Bld 125 (*) 70 - 99 mg/dL   BUN 15  6 - 23 mg/dL   Creatinine, Ser 0.61  0.50 - 1.10 mg/dL   Calcium 9.7  8.4 - 10.5 mg/dL   Total Protein 7.3  6.0 - 8.3 g/dL   Albumin 4.1  3.5 - 5.2 g/dL   AST 18  0 - 37 U/L   ALT 22  0 - 35 U/L   Alkaline Phosphatase 68  39 - 117 U/L   Total Bilirubin 0.4  0.3 - 1.2 mg/dL   GFR calc non Af Amer >90  >90 mL/min   GFR calc Af Amer >90  >90 mL/min  LIPASE, BLOOD      Result Value Ref Range   Lipase 36  11 - 59 U/L  URINALYSIS, ROUTINE W REFLEX MICROSCOPIC      Result Value Ref Range   Color, Urine YELLOW  YELLOW   APPearance CLEAR  CLEAR   Specific Gravity, Urine 1.008  1.005 - 1.030   pH 7.0  5.0 - 8.0   Glucose, UA NEGATIVE  NEGATIVE mg/dL   Hgb urine dipstick NEGATIVE  NEGATIVE   Bilirubin Urine NEGATIVE  NEGATIVE   Ketones, ur NEGATIVE  NEGATIVE mg/dL   Protein, ur NEGATIVE  NEGATIVE mg/dL   Urobilinogen, UA 0.2  0.0 - 1.0 mg/dL   Nitrite NEGATIVE  NEGATIVE   Leukocytes, UA NEGATIVE  NEGATIVE   US Abdomen Complete  01/04/2014   CLINICAL DATA:  Abdominal pain  EXAM: ULTRASOUND ABDOMEN COMPLETE  COMPARISON:  DG ABD ACUTE W/CHEST dated  01/04/2014; MR L SPINE W/O dated 09/11/2010; CT ANGIO CHEST W/CM &/OR WO/CM dated 09/22/2009  FINDINGS: Gallbladder:  No gallstones or wall thickening visualized. No sonographic Murphy sign noted.  Common bile duct:  Diameter: 1.9  Liver:  No focal lesion identified. The echotexture of the liver is mildly increased which suggests fatty infiltrative change. Portal venous flow is normal in direction toward the liver.  IVC:  No abnormality visualized.  Pancreas:  Visualized portion unremarkable.  Spleen:  Size and appearance within normal limits.  Right Kidney:  Length: 11.2 cm. Echogenicity within normal limits. No mass or hydronephrosis visualized. Above the upper pole of the right kidney there is a complex appearing generally hypoechoic focus measuring 2.3 x 1.6 x 1.7 cm. This area was  covered on a PE chest CT study dated September 22, 2009 at which time the area appeared normal.  Left Kidney:  Length: 10.5 cm. Echogenicity within normal limits. No mass or hydronephrosis visualized.  Abdominal aorta:  No aneurysm visualized.  Other findings:  None.  IMPRESSION: 1. There is no evidence of acute hepatobiliary disease. Specifically no gallstones are evident. 2. There is a complex appearing hypoechoic focus just above the upper pole of the right kidney. This area was normal in appearance on the previous CT scan of the chest in November of 2010. This could be related to the right adrenal gland or could reflect an exophytic upper pole right kidney lesion. A CT scan of the abdomen and pelvis with contrast or MRI of the abdomen is recommended.   Electronically Signed   By: Hilda Rynders  [place]   On: 01/04/2014 08:42   Dg Abd Acute W/chest  01/04/2014   CLINICAL DATA:  Upper abdominal pain and nausea.  EXAM: ACUTE ABDOMEN SERIES (ABDOMEN 2 VIEW & CHEST 1 VIEW)  COMPARISON:  Chest radiograph performed 11/24/2013  FINDINGS: The lungs are well-aerated and clear. There is no evidence of focal opacification, pleural effusion or  pneumothorax. The cardiomediastinal silhouette is within normal limits.  The visualized bowel gas pattern is unremarkable. Scattered stool and air are seen within the colon; there is no evidence of small bowel dilatation to suggest obstruction. No free intra-abdominal air is identified on the provided upright view.  No acute osseous abnormalities are seen; the sacroiliac joints are unremarkable in appearance.  IMPRESSION: 1. Unremarkable bowel gas pattern; no free intra-abdominal air seen. Moderate stool burden noted. 2. No acute cardiopulmonary process identified.   Electronically Signed   By: Garald Balding M.D.   On: 01/04/2014 06:45      Wandra Arthurs, MD 01/04/14 [phone]

## 2014-01-04 NOTE — ED Notes (Signed)
EKG given to Dr. Campos 

## 2014-01-04 NOTE — ED Notes (Signed)
Ultrasound at bedside. Exam in progress.

## 2014-01-19 DIAGNOSIS — J309 Allergic rhinitis, unspecified: Secondary | ICD-10-CM | POA: Diagnosis not present

## 2014-01-19 DIAGNOSIS — N898 Other specified noninflammatory disorders of vagina: Secondary | ICD-10-CM | POA: Diagnosis not present

## 2014-01-19 DIAGNOSIS — M545 Low back pain, unspecified: Secondary | ICD-10-CM | POA: Diagnosis not present

## 2014-03-02 ENCOUNTER — Other Ambulatory Visit: Payer: Self-pay | Admitting: Family Medicine

## 2014-03-02 ENCOUNTER — Other Ambulatory Visit (HOSPITAL_COMMUNITY)
Admission: RE | Admit: 2014-03-02 | Discharge: 2014-03-02 | Disposition: A | Discharge status: Home / Self Care | Payer: Medicare Other | Source: Ambulatory Visit | Attending: Family Medicine | Admitting: Family Medicine

## 2014-03-02 DIAGNOSIS — Z79899 Other long term (current) drug therapy: Secondary | ICD-10-CM | POA: Diagnosis not present

## 2014-03-02 DIAGNOSIS — Z1151 Encounter for screening for human papillomavirus (HPV): Secondary | ICD-10-CM | POA: Diagnosis not present

## 2014-03-02 DIAGNOSIS — R87619 Unspecified abnormal cytological findings in specimens from cervix uteri: Secondary | ICD-10-CM | POA: Insufficient documentation

## 2014-03-02 DIAGNOSIS — Z124 Encounter for screening for malignant neoplasm of cervix: Secondary | ICD-10-CM | POA: Insufficient documentation

## 2014-03-02 DIAGNOSIS — F17200 Nicotine dependence, unspecified, uncomplicated: Secondary | ICD-10-CM | POA: Diagnosis not present

## 2014-03-02 DIAGNOSIS — N951 Menopausal and female climacteric states: Secondary | ICD-10-CM | POA: Diagnosis not present

## 2014-03-02 DIAGNOSIS — E559 Vitamin D deficiency, unspecified: Secondary | ICD-10-CM | POA: Diagnosis not present

## 2014-03-02 DIAGNOSIS — E7800 Pure hypercholesterolemia, unspecified: Secondary | ICD-10-CM | POA: Diagnosis not present

## 2014-03-02 DIAGNOSIS — M5126 Other intervertebral disc displacement, lumbar region: Secondary | ICD-10-CM | POA: Diagnosis not present

## 2014-03-02 DIAGNOSIS — Z0000 Encounter for general adult medical examination without abnormal findings: Secondary | ICD-10-CM | POA: Diagnosis not present

## 2014-03-02 DIAGNOSIS — IMO0002 Reserved for concepts with insufficient information to code with codable children: Secondary | ICD-10-CM | POA: Diagnosis not present

## 2014-03-02 DIAGNOSIS — K589 Irritable bowel syndrome without diarrhea: Secondary | ICD-10-CM | POA: Diagnosis not present

## 2014-03-03 DIAGNOSIS — B9789 Other viral agents as the cause of diseases classified elsewhere: Secondary | ICD-10-CM | POA: Diagnosis not present

## 2014-03-09 ENCOUNTER — Encounter (HOSPITAL_COMMUNITY): Payer: Self-pay | Admitting: Emergency Medicine

## 2014-03-09 ENCOUNTER — Emergency Department (HOSPITAL_COMMUNITY): Payer: Medicare Other

## 2014-03-09 ENCOUNTER — Emergency Department (HOSPITAL_COMMUNITY)
Admission: EM | Admit: 2014-03-09 | Discharge: 2014-03-09 | Disposition: A | Discharge status: Home / Self Care | Payer: Medicare Other | Attending: Emergency Medicine | Admitting: Emergency Medicine

## 2014-03-09 DIAGNOSIS — F17200 Nicotine dependence, unspecified, uncomplicated: Secondary | ICD-10-CM | POA: Insufficient documentation

## 2014-03-09 DIAGNOSIS — Z8639 Personal history of other endocrine, nutritional and metabolic disease: Secondary | ICD-10-CM | POA: Insufficient documentation

## 2014-03-09 DIAGNOSIS — I10 Essential (primary) hypertension: Secondary | ICD-10-CM | POA: Diagnosis not present

## 2014-03-09 DIAGNOSIS — R110 Nausea: Secondary | ICD-10-CM | POA: Diagnosis not present

## 2014-03-09 DIAGNOSIS — R079 Chest pain, unspecified: Secondary | ICD-10-CM | POA: Diagnosis not present

## 2014-03-09 DIAGNOSIS — J069 Acute upper respiratory infection, unspecified: Secondary | ICD-10-CM | POA: Diagnosis not present

## 2014-03-09 DIAGNOSIS — Z79899 Other long term (current) drug therapy: Secondary | ICD-10-CM | POA: Diagnosis not present

## 2014-03-09 DIAGNOSIS — B309 Viral conjunctivitis, unspecified: Secondary | ICD-10-CM | POA: Diagnosis not present

## 2014-03-09 DIAGNOSIS — R0789 Other chest pain: Secondary | ICD-10-CM | POA: Diagnosis not present

## 2014-03-09 DIAGNOSIS — Z862 Personal history of diseases of the blood and blood-forming organs and certain disorders involving the immune mechanism: Secondary | ICD-10-CM | POA: Diagnosis not present

## 2014-03-09 DIAGNOSIS — H1030 Unspecified acute conjunctivitis, unspecified eye: Secondary | ICD-10-CM | POA: Diagnosis not present

## 2014-03-09 DIAGNOSIS — H109 Unspecified conjunctivitis: Secondary | ICD-10-CM

## 2014-03-09 HISTORY — DX: Essential (primary) hypertension: I10

## 2014-03-09 LAB — CBC WITH DIFFERENTIAL/PLATELET
BASOS ABS: 0 10*3/uL (ref 0.0–0.1)
Basophils Relative: 0 % (ref 0–1)
EOS ABS: 0.2 10*3/uL (ref 0.0–0.7)
EOS PCT: 3 % (ref 0–5)
HCT: 41.9 % (ref 36.0–46.0)
Hemoglobin: 14.1 g/dL (ref 12.0–15.0)
Lymphocytes Relative: 30 % (ref 12–46)
Lymphs Abs: 2.4 10*3/uL (ref 0.7–4.0)
MCH: 30 pg (ref 26.0–34.0)
MCHC: 33.7 g/dL (ref 30.0–36.0)
MCV: 89.1 fL (ref 78.0–100.0)
Monocytes Absolute: 0.6 10*3/uL (ref 0.1–1.0)
Monocytes Relative: 8 % (ref 3–12)
NEUTROS PCT: 60 % (ref 43–77)
Neutro Abs: 4.8 10*3/uL (ref 1.7–7.7)
Platelets: 240 10*3/uL (ref 150–400)
RBC: 4.7 MIL/uL (ref 3.87–5.11)
RDW: 12.4 % (ref 11.5–15.5)
WBC: 8.1 10*3/uL (ref 4.0–10.5)

## 2014-03-09 LAB — COMPREHENSIVE METABOLIC PANEL
ALBUMIN: 3.8 g/dL (ref 3.5–5.2)
ALT: 22 U/L (ref 0–35)
AST: 19 U/L (ref 0–37)
Alkaline Phosphatase: 81 U/L (ref 39–117)
BUN: 12 mg/dL (ref 6–23)
CALCIUM: 9.8 mg/dL (ref 8.4–10.5)
CO2: 28 mEq/L (ref 19–32)
CREATININE: 0.64 mg/dL (ref 0.50–1.10)
Chloride: 101 mEq/L (ref 96–112)
GFR calc Af Amer: >90 mL/min (ref >90)
GFR calc non Af Amer: >90 mL/min (ref >90)
Glucose, Bld: 147 mg/dL — ABNORMAL HIGH (ref 70–99)
Potassium: 3.9 mEq/L (ref 3.7–5.3)
SODIUM: 140 meq/L (ref 137–147)
Total Bilirubin: <0.2 mg/dL — ABNORMAL LOW (ref 0.3–1.2)
Total Protein: 7.2 g/dL (ref 6.0–8.3)

## 2014-03-09 LAB — TROPONIN I: Troponin I: <0.3 ng/mL (ref <0.30)

## 2014-03-09 MED ORDER — HYDROCOD POLST-CHLORPHEN POLST 10-8 MG/5ML PO LQCR
5.0000 mL | Freq: Once | ORAL | Status: DC
Start: 2014-03-09 — End: 2017-03-29

## 2014-03-09 MED ORDER — ERYTHROMYCIN 5 MG/GM OP OINT
TOPICAL_OINTMENT | Freq: Four times a day (QID) | OPHTHALMIC | Status: DC
  Administered 2014-03-09: 1 via OPHTHALMIC
  Filled 2014-03-09 (×2): qty 3.5

## 2014-03-09 MED ORDER — HYDROCOD POLST-CHLORPHEN POLST 10-8 MG/5ML PO LQCR
5.0000 mL | Freq: Once | ORAL | Status: AC
  Administered 2014-03-09: 5 mL via ORAL
  Filled 2014-03-09: qty 5

## 2014-03-09 NOTE — ED Provider Notes (Signed)
Medical screening examination/treatment/procedure(s) were performed by non-physician practitioner and as supervising physician I was immediately available for consultation/collaboration.   EKG Interpretation None       Threasa Beards, MD 03/09/14 2324

## 2014-03-09 NOTE — ED Provider Notes (Signed)
CSN: [id number]     Arrival date & time 03/09/14  1913 History   First MD Initiated Contact with Patient 03/09/14 2115     Chief Complaint  Patient presents with  . Fatigue     (Consider location/radiation/quality/duration/timing/severity/associated sxs/prior Treatment) Patient is a 58 y.o. female presenting with chest pain. The history is provided by the patient. No language interpreter was used.  Chest Pain Pain location:  L chest Associated symptoms: cough, fatigue, nausea, shortness of breath and weakness   Associated symptoms: no abdominal pain, no fever, no headache and not vomiting   Associated symptoms comment:  She presents with upper respiratory congestion, eye redness, nasal congestion and sore throat for the past 5 days. No fever. She wakes in the morning with matting of her eyes. For the past 2 days she has had a pressure type discomfort in her chest. It is not affected by cough or activity. She has also had nausea and extreme fatigue. No syncope or near syncope. She states that her doctor put her on amlodipine 4 days ago and symptoms of chest pressure, fatigue and shortness of breath started after she began the medication. She took it for 2 days and stopped (2 days ago).    Past Medical History  Diagnosis Date  . High cholesterol   . Hypertension    History reviewed. No pertinent past surgical history. Family History  Problem Relation Age of Onset  . Hypertension Mother    History  Substance Use Topics  . Smoking status: Current Every Day Smoker -- 0.50 packs/day    Types: Cigarettes  . Smokeless tobacco: Not on file  . Alcohol Use: Yes     Comment: occ   OB History   Grav Para Term Preterm Abortions TAB SAB Ect Mult Living                 Review of Systems  Constitutional: Positive for fatigue. Negative for fever.  HENT: Positive for congestion and sore throat.   Eyes: Positive for discharge and redness.  Respiratory: Positive for cough and shortness of  breath.   Cardiovascular: Positive for chest pain.  Gastrointestinal: Positive for nausea. Negative for vomiting and abdominal pain.  Musculoskeletal: Negative for myalgias.  Skin: Negative for rash.  Neurological: Positive for weakness. Negative for speech difficulty and headaches.      Allergies  Boniva  Home Medications   Prior to Admission medications   Medication Sig Start Date End Date Taking? Authorizing Provider  amLODipine (NORVASC) 5 MG tablet Take 5 mg by mouth daily.   Yes Historical Provider, MD  famotidine (PEPCID) 20 MG tablet Take 1 tablet (20 mg total) by mouth 2 (two) times daily as needed for heartburn or indigestion. 01/04/14  Yes Wandra Arthurs, MD  Multiple Vitamin (MULTIVITAMIN WITH MINERALS) TABS tablet Take 1 tablet by mouth daily.   Yes Historical Provider, MD  naproxen (NAPROSYN) 500 MG tablet Take 500 mg by mouth 2 (two) times daily as needed for mild pain.   Yes Historical Provider, MD  Pseudoeph-Doxylamine-DM-APAP (NYQUIL PO) Take 1-2 capsules by mouth at bedtime as needed (cough, cold symptoms).    Yes Historical Provider, MD  Pseudoephedrine-APAP-DM (DAYQUIL PO) Take 1-2 capsules by mouth 2 (two) times daily as needed (cough, cold symptoms).    Yes Historical Provider, MD   BP 139/104  Pulse 65  Temp(Src) 98.8 F (37.1 C) (Oral)  Resp 18  Ht 5\' 3"  (1.6 m)  Wt 175 lb (79.379 kg)  BMI 31.01 kg/m2  SpO2 97% Physical Exam  Constitutional: She is oriented to person, place, and time. She appears well-developed and well-nourished.  HENT:  Head: Normocephalic.  Eyes: Pupils are equal, round, and reactive to light.  Eyes injected, moderately red bilaterally with clear drainage. No purulence.   Neck: Normal range of motion. Neck supple.  Cardiovascular: Normal rate and regular rhythm.   Pulmonary/Chest: Effort normal and breath sounds normal.  Abdominal: Soft. Bowel sounds are normal. There is no tenderness. There is no rebound and no guarding.   Musculoskeletal: Normal range of motion.  Neurological: She is alert and oriented to person, place, and time.  Skin: Skin is warm and dry. No rash noted.  Psychiatric: She has a normal mood and affect.    ED Course  Procedures (including critical care time) Labs Review Labs Reviewed  COMPREHENSIVE METABOLIC PANEL - Abnormal; Notable for the following:    Glucose, Bld 147 (*)    Total Bilirubin <0.2 (*)    All other components within normal limits  CBC WITH DIFFERENTIAL  TROPONIN I    Imaging Review Dg Chest 2 View  03/09/2014   CLINICAL DATA:  Mid lower chest pain, productive cough.  EXAM: CHEST  2 VIEW  COMPARISON:  DG ABD ACUTE W/CHEST dated 01/04/2014  FINDINGS: Cardiomediastinal silhouette is unremarkable. Very mild interstitial prominence, relatively unchanged. The lungs are otherwise clear without pleural effusions or focal consolidations. Trachea projects midline and there is no pneumothorax. Soft tissue planes and included osseous structures are non-suspicious.  IMPRESSION: Chronic mild interstitial prominence could reflect reactive airway disease, unchanged.   Electronically Signed   By: Elon Alas   On: 03/09/2014 22:34     EKG Interpretation None      MDM   Final diagnoses:  None    1. URI 2. Viral conjunctivitis  Cardiac evaluation negative (neg EKG, trop) and symptoms atypical. Doubt ACS. Suspect symptoms of chest pressure related to URI illness. Supportive care and PCP follow up recommended.    Dewaine Oats, PA-C 03/09/14 2309

## 2014-03-09 NOTE — ED Notes (Signed)
Pt states that she began taking a new BP med amlodipine and since has became SOB, nasal congested and unable to sleep at night with dizziness and weakness. Pt states she took meds for 2 days and then stopped. Pt alert in triage area. Pt has cough that is non productive. Pt ambulatory. Pt denies fever, chills or pain but states she is just tired.

## 2014-03-09 NOTE — ED Notes (Signed)
Pt called x1 for room, no response.

## 2014-03-09 NOTE — ED Notes (Signed)
Pt called x2 no response

## 2014-03-09 NOTE — Discharge Instructions (Signed)
Conjunctivitis Conjunctivitis is commonly called "pink eye." Conjunctivitis can be caused by bacterial or viral infection, allergies, or injuries. There is usually redness of the lining of the eye, itching, discomfort, and sometimes discharge. There may be deposits of matter along the eyelids. A viral infection usually causes a watery discharge, while a bacterial infection causes a yellowish, thick discharge. Pink eye is very contagious and spreads by direct contact. You may be given antibiotic eyedrops as part of your treatment. Before using your eye medicine, remove all drainage from the eye by washing gently with warm water and cotton balls. Continue to use the medication until you have awakened 2 mornings in a row without discharge from the eye. Do not rub your eye. This increases the irritation and helps spread infection. Use separate towels from other household members. Wash your hands with soap and water before and after touching your eyes. Use cold compresses to reduce pain and sunglasses to relieve irritation from light. Do not wear contact lenses or wear eye makeup until the infection is gone. SEEK MEDICAL CARE IF:   Your symptoms are not better after 3 days of treatment.  You have increased pain or trouble seeing.  The outer eyelids become very red or swollen. Document Released: 12/06/2004 Document Revised: 01/21/2012 Document Reviewed: 10/29/2005 [facility] Patient Information 2014 [place]. Upper Respiratory Infection, Adult An upper respiratory infection (URI) is also known as the common cold. It is often caused by a type of germ (virus). Colds are easily spread (contagious). You can pass it to others by kissing, coughing, sneezing, or drinking out of the same glass. Usually, you get better in 1 or 2 weeks.  HOME CARE   Only take medicine as told by your doctor.  Use a warm mist humidifier or breathe in steam from a hot shower.  Drink enough water and fluids to keep your pee  (urine) clear or pale yellow.  Get plenty of rest.  Return to work when your temperature is back to normal or as told by your doctor. You may use a face mask and wash your hands to stop your cold from spreading. GET HELP RIGHT AWAY IF:   After the first few days, you feel you are getting worse.  You have questions about your medicine.  You have chills, shortness of breath, or brown or red spit (mucus).  You have yellow or brown snot (nasal discharge) or pain in the face, especially when you bend forward.  You have a fever, puffy (swollen) neck, pain when you swallow, or white spots in the back of your throat.  You have a bad headache, ear pain, sinus pain, or chest pain.  You have a high-pitched whistling sound when you breathe in and out (wheezing).  You have a lasting cough or cough up blood.  You have sore muscles or a stiff neck. MAKE SURE YOU:   Understand these instructions.  Will watch your condition.  Will get help right away if you are not doing well or get worse. Document Released: 04/16/2008 Document Revised: 01/21/2012 Document Reviewed: 03/05/2011 [facility] Patient Information 2014 [place], [place].

## 2014-03-16 DIAGNOSIS — M5020 Other cervical disc displacement, unspecified cervical region: Secondary | ICD-10-CM | POA: Diagnosis not present

## 2014-03-23 DIAGNOSIS — M5126 Other intervertebral disc displacement, lumbar region: Secondary | ICD-10-CM | POA: Diagnosis not present

## 2014-04-28 DIAGNOSIS — I10 Essential (primary) hypertension: Secondary | ICD-10-CM | POA: Diagnosis not present

## 2014-04-28 DIAGNOSIS — Z6831 Body mass index (BMI) 31.0-31.9, adult: Secondary | ICD-10-CM | POA: Diagnosis not present

## 2014-04-28 DIAGNOSIS — M5126 Other intervertebral disc displacement, lumbar region: Secondary | ICD-10-CM | POA: Diagnosis not present

## 2014-04-28 DIAGNOSIS — M47817 Spondylosis without myelopathy or radiculopathy, lumbosacral region: Secondary | ICD-10-CM | POA: Diagnosis not present

## 2014-05-06 DIAGNOSIS — M25539 Pain in unspecified wrist: Secondary | ICD-10-CM | POA: Diagnosis not present

## 2014-05-06 DIAGNOSIS — K120 Recurrent oral aphthae: Secondary | ICD-10-CM | POA: Diagnosis not present

## 2014-05-20 DIAGNOSIS — K1230 Oral mucositis (ulcerative), unspecified: Secondary | ICD-10-CM | POA: Diagnosis not present

## 2014-05-20 DIAGNOSIS — M5126 Other intervertebral disc displacement, lumbar region: Secondary | ICD-10-CM | POA: Diagnosis not present

## 2014-05-20 DIAGNOSIS — K121 Other forms of stomatitis: Secondary | ICD-10-CM | POA: Diagnosis not present

## 2014-05-31 DIAGNOSIS — B079 Viral wart, unspecified: Secondary | ICD-10-CM | POA: Diagnosis not present

## 2014-05-31 DIAGNOSIS — K1370 Unspecified lesions of oral mucosa: Secondary | ICD-10-CM | POA: Diagnosis not present

## 2014-05-31 DIAGNOSIS — K0510 Chronic gingivitis, plaque induced: Secondary | ICD-10-CM | POA: Diagnosis not present

## 2014-06-21 ENCOUNTER — Other Ambulatory Visit: Payer: Self-pay | Admitting: Otolaryngology

## 2014-06-21 DIAGNOSIS — J3489 Other specified disorders of nose and nasal sinuses: Secondary | ICD-10-CM | POA: Diagnosis not present

## 2014-06-21 DIAGNOSIS — B079 Viral wart, unspecified: Secondary | ICD-10-CM | POA: Diagnosis not present

## 2014-07-05 DIAGNOSIS — Z6830 Body mass index (BMI) 30.0-30.9, adult: Secondary | ICD-10-CM | POA: Diagnosis not present

## 2014-07-05 DIAGNOSIS — M25539 Pain in unspecified wrist: Secondary | ICD-10-CM | POA: Diagnosis not present

## 2014-07-05 DIAGNOSIS — R5383 Other fatigue: Secondary | ICD-10-CM | POA: Diagnosis not present

## 2014-07-05 DIAGNOSIS — Z713 Dietary counseling and surveillance: Secondary | ICD-10-CM | POA: Diagnosis not present

## 2014-07-05 DIAGNOSIS — E669 Obesity, unspecified: Secondary | ICD-10-CM | POA: Diagnosis not present

## 2014-07-05 DIAGNOSIS — J3489 Other specified disorders of nose and nasal sinuses: Secondary | ICD-10-CM | POA: Diagnosis not present

## 2014-07-05 DIAGNOSIS — R5381 Other malaise: Secondary | ICD-10-CM | POA: Diagnosis not present

## 2014-07-26 DIAGNOSIS — M654 Radial styloid tenosynovitis [de Quervain]: Secondary | ICD-10-CM | POA: Diagnosis not present

## 2014-08-04 DIAGNOSIS — M25579 Pain in unspecified ankle and joints of unspecified foot: Secondary | ICD-10-CM | POA: Diagnosis not present

## 2014-08-27 DIAGNOSIS — M654 Radial styloid tenosynovitis [de Quervain]: Secondary | ICD-10-CM | POA: Diagnosis not present

## 2014-10-05 DIAGNOSIS — G4710 Hypersomnia, unspecified: Secondary | ICD-10-CM | POA: Diagnosis not present

## 2014-10-30 ENCOUNTER — Encounter: Payer: Self-pay | Admitting: *Deleted

## 2014-11-26 DIAGNOSIS — E559 Vitamin D deficiency, unspecified: Secondary | ICD-10-CM | POA: Diagnosis not present

## 2014-11-26 DIAGNOSIS — I10 Essential (primary) hypertension: Secondary | ICD-10-CM | POA: Diagnosis not present

## 2014-11-26 DIAGNOSIS — M5126 Other intervertebral disc displacement, lumbar region: Secondary | ICD-10-CM | POA: Diagnosis not present

## 2014-11-26 DIAGNOSIS — E780 Pure hypercholesterolemia: Secondary | ICD-10-CM | POA: Diagnosis not present

## 2014-11-26 DIAGNOSIS — F17210 Nicotine dependence, cigarettes, uncomplicated: Secondary | ICD-10-CM | POA: Diagnosis not present

## 2014-11-26 DIAGNOSIS — R739 Hyperglycemia, unspecified: Secondary | ICD-10-CM | POA: Diagnosis not present

## 2014-11-26 DIAGNOSIS — K589 Irritable bowel syndrome without diarrhea: Secondary | ICD-10-CM | POA: Diagnosis not present

## 2014-11-26 DIAGNOSIS — J309 Allergic rhinitis, unspecified: Secondary | ICD-10-CM | POA: Diagnosis not present

## 2014-11-26 DIAGNOSIS — M5416 Radiculopathy, lumbar region: Secondary | ICD-10-CM | POA: Diagnosis not present

## 2014-11-26 DIAGNOSIS — F324 Major depressive disorder, single episode, in partial remission: Secondary | ICD-10-CM | POA: Diagnosis not present

## 2015-01-12 DIAGNOSIS — J069 Acute upper respiratory infection, unspecified: Secondary | ICD-10-CM | POA: Diagnosis not present

## 2015-01-20 DIAGNOSIS — R197 Diarrhea, unspecified: Secondary | ICD-10-CM | POA: Diagnosis not present

## 2015-01-20 DIAGNOSIS — J209 Acute bronchitis, unspecified: Secondary | ICD-10-CM | POA: Diagnosis not present

## 2015-01-20 DIAGNOSIS — J111 Influenza due to unidentified influenza virus with other respiratory manifestations: Secondary | ICD-10-CM | POA: Diagnosis not present

## 2015-03-24 ENCOUNTER — Other Ambulatory Visit: Payer: Self-pay

## 2015-03-24 DIAGNOSIS — E559 Vitamin D deficiency, unspecified: Secondary | ICD-10-CM | POA: Diagnosis not present

## 2015-03-24 DIAGNOSIS — Z0000 Encounter for general adult medical examination without abnormal findings: Secondary | ICD-10-CM | POA: Diagnosis not present

## 2015-03-24 DIAGNOSIS — I10 Essential (primary) hypertension: Secondary | ICD-10-CM | POA: Diagnosis not present

## 2015-03-24 DIAGNOSIS — R739 Hyperglycemia, unspecified: Secondary | ICD-10-CM | POA: Diagnosis not present

## 2015-03-24 DIAGNOSIS — F322 Major depressive disorder, single episode, severe without psychotic features: Secondary | ICD-10-CM | POA: Diagnosis not present

## 2015-03-24 DIAGNOSIS — E780 Pure hypercholesterolemia: Secondary | ICD-10-CM | POA: Diagnosis not present

## 2015-03-24 DIAGNOSIS — Z79899 Other long term (current) drug therapy: Secondary | ICD-10-CM | POA: Diagnosis not present

## 2015-04-05 DIAGNOSIS — Z1231 Encounter for screening mammogram for malignant neoplasm of breast: Secondary | ICD-10-CM | POA: Diagnosis not present

## 2016-01-02 DIAGNOSIS — K648 Other hemorrhoids: Secondary | ICD-10-CM | POA: Diagnosis not present

## 2016-01-05 DIAGNOSIS — H40033 Anatomical narrow angle, bilateral: Secondary | ICD-10-CM | POA: Diagnosis not present

## 2016-01-05 DIAGNOSIS — H04123 Dry eye syndrome of bilateral lacrimal glands: Secondary | ICD-10-CM | POA: Diagnosis not present

## 2016-01-09 DIAGNOSIS — M549 Dorsalgia, unspecified: Secondary | ICD-10-CM | POA: Diagnosis not present

## 2016-01-09 DIAGNOSIS — K649 Unspecified hemorrhoids: Secondary | ICD-10-CM | POA: Diagnosis not present

## 2016-01-20 ENCOUNTER — Ambulatory Visit
Admission: RE | Admit: 2016-01-20 | Discharge: 2016-01-20 | Disposition: A | Discharge status: Home / Self Care | Payer: Medicare Other | Source: Ambulatory Visit | Attending: Family Medicine | Admitting: Family Medicine

## 2016-01-20 ENCOUNTER — Other Ambulatory Visit: Payer: Self-pay | Admitting: Family Medicine

## 2016-01-20 DIAGNOSIS — J309 Allergic rhinitis, unspecified: Secondary | ICD-10-CM | POA: Diagnosis not present

## 2016-01-20 DIAGNOSIS — M47812 Spondylosis without myelopathy or radiculopathy, cervical region: Secondary | ICD-10-CM | POA: Diagnosis not present

## 2016-01-20 DIAGNOSIS — M5412 Radiculopathy, cervical region: Secondary | ICD-10-CM

## 2016-01-20 DIAGNOSIS — L309 Dermatitis, unspecified: Secondary | ICD-10-CM | POA: Diagnosis not present

## 2016-02-16 DIAGNOSIS — M5412 Radiculopathy, cervical region: Secondary | ICD-10-CM | POA: Diagnosis not present

## 2016-02-16 DIAGNOSIS — Z6831 Body mass index (BMI) 31.0-31.9, adult: Secondary | ICD-10-CM | POA: Diagnosis not present

## 2016-03-15 DIAGNOSIS — M8589 Other specified disorders of bone density and structure, multiple sites: Secondary | ICD-10-CM | POA: Diagnosis not present

## 2016-03-15 DIAGNOSIS — M859 Disorder of bone density and structure, unspecified: Secondary | ICD-10-CM | POA: Diagnosis not present

## 2016-03-20 DIAGNOSIS — J209 Acute bronchitis, unspecified: Secondary | ICD-10-CM | POA: Diagnosis not present

## 2016-03-27 DIAGNOSIS — M5412 Radiculopathy, cervical region: Secondary | ICD-10-CM | POA: Diagnosis not present

## 2016-04-02 DIAGNOSIS — Z720 Tobacco use: Secondary | ICD-10-CM | POA: Diagnosis not present

## 2016-04-02 DIAGNOSIS — Z6830 Body mass index (BMI) 30.0-30.9, adult: Secondary | ICD-10-CM | POA: Diagnosis not present

## 2016-04-02 DIAGNOSIS — M5126 Other intervertebral disc displacement, lumbar region: Secondary | ICD-10-CM | POA: Diagnosis not present

## 2016-04-02 DIAGNOSIS — R739 Hyperglycemia, unspecified: Secondary | ICD-10-CM | POA: Diagnosis not present

## 2016-04-02 DIAGNOSIS — G4700 Insomnia, unspecified: Secondary | ICD-10-CM | POA: Diagnosis not present

## 2016-04-02 DIAGNOSIS — Z0000 Encounter for general adult medical examination without abnormal findings: Secondary | ICD-10-CM | POA: Diagnosis not present

## 2016-04-02 DIAGNOSIS — E559 Vitamin D deficiency, unspecified: Secondary | ICD-10-CM | POA: Diagnosis not present

## 2016-04-02 DIAGNOSIS — I10 Essential (primary) hypertension: Secondary | ICD-10-CM | POA: Diagnosis not present

## 2016-04-02 DIAGNOSIS — Z79899 Other long term (current) drug therapy: Secondary | ICD-10-CM | POA: Diagnosis not present

## 2016-04-02 DIAGNOSIS — F324 Major depressive disorder, single episode, in partial remission: Secondary | ICD-10-CM | POA: Diagnosis not present

## 2016-04-02 DIAGNOSIS — E7800 Pure hypercholesterolemia, unspecified: Secondary | ICD-10-CM | POA: Diagnosis not present

## 2016-04-05 DIAGNOSIS — Z7984 Long term (current) use of oral hypoglycemic drugs: Secondary | ICD-10-CM | POA: Diagnosis not present

## 2016-04-05 DIAGNOSIS — E119 Type 2 diabetes mellitus without complications: Secondary | ICD-10-CM | POA: Diagnosis not present

## 2016-04-05 DIAGNOSIS — J209 Acute bronchitis, unspecified: Secondary | ICD-10-CM | POA: Diagnosis not present

## 2016-04-10 DIAGNOSIS — Z1211 Encounter for screening for malignant neoplasm of colon: Secondary | ICD-10-CM | POA: Diagnosis not present

## 2016-04-24 DIAGNOSIS — F331 Major depressive disorder, recurrent, moderate: Secondary | ICD-10-CM | POA: Diagnosis not present

## 2016-04-26 DIAGNOSIS — L309 Dermatitis, unspecified: Secondary | ICD-10-CM | POA: Diagnosis not present

## 2016-04-26 DIAGNOSIS — Z7984 Long term (current) use of oral hypoglycemic drugs: Secondary | ICD-10-CM | POA: Diagnosis not present

## 2016-04-26 DIAGNOSIS — N760 Acute vaginitis: Secondary | ICD-10-CM | POA: Diagnosis not present

## 2016-04-26 DIAGNOSIS — E119 Type 2 diabetes mellitus without complications: Secondary | ICD-10-CM | POA: Diagnosis not present

## 2016-05-28 ENCOUNTER — Ambulatory Visit: Payer: Medicare Other | Admitting: *Deleted

## 2016-06-05 ENCOUNTER — Encounter: Payer: Self-pay | Admitting: Dietician

## 2016-06-05 ENCOUNTER — Encounter: Payer: Medicare Other | Attending: Family Medicine | Admitting: Dietician

## 2016-06-05 DIAGNOSIS — Z713 Dietary counseling and surveillance: Secondary | ICD-10-CM | POA: Diagnosis not present

## 2016-06-05 DIAGNOSIS — E119 Type 2 diabetes mellitus without complications: Secondary | ICD-10-CM | POA: Insufficient documentation

## 2016-06-05 DIAGNOSIS — E669 Obesity, unspecified: Secondary | ICD-10-CM

## 2016-06-05 NOTE — Patient Instructions (Signed)
Be as active as possible. Be mindful of your portion sizes.  Aim for 3 Carb Choices per meal (45 grams) +/- 1 either way  Aim for 0-1 Carbs per snack if hungry  Include protein in moderation with your meals and snacks Consider reading food labels for Total Carbohydrate and Fat Grams of foods Consider  increasing your activity level by walking, swimming for 30 minutes daily as tolerated Consider checking BG at alternate times per day as directed by MD  Consider taking medication as directed by MD

## 2016-06-05 NOTE — Progress Notes (Signed)
Diabetes Self-Management Education  Visit Type: First/Initial  Appt. Start Time: 1400 Appt. End Time: [id number]  06/05/2016  Ms. Debra Morrison, identified by name and date of birth, is a 60 y.o. female with a diagnosis of Diabetes: Type 2.   This was newly diagnosed in May.  Primary concerns today: Patient is here alone.  She would like to learn how to eat healthfully. She has been diagnosed  With type 2 diabetes about 2 months ago and on Invokamet for the past month.  She does not tolerate when she takes it twice a day and did not tolerate the metformin.  She has lost 7 lbs in the past month.  She checks her blood sugar 2 times per day.  CBG in am 148 and 190 after meals. She has not checked her eyes in the past year, does go to the dentis and has not checked her feet.  She is taking Invokamet once a day.  She skips the second dose due to GI symptoms but is willing to retry.  Weight hx: Lowest adult weight: 120 lbs Highest adult weight:  175 lbs She recently lost from 174 lbs to 167 lbs today.    Patient lives with her husband.  They moved here from [place] 30 years ago. She gained 35 lbs at that time.  She does the cooking and shopping.  She is not employed.  ASSESSMENT  Height 5\' 3"  (1.6 m), weight 167 lb (75.8 kg). Body mass index is 29.58 kg/m.      Diabetes Self-Management Education - 06/05/16 1434      Visit Information   Visit Type First/Initial     Initial Visit   Diabetes Type Type 2   Are you currently following a meal plan? No   Are you taking your medications as prescribed? Yes   Date Diagnosed 2 months ago     Health Coping   How would you rate your overall health? Fair     Psychosocial Assessment   Patient Belief/Attitude about Diabetes Afraid   Self-care barriers None   Self-management support Doctor's office   Other persons present Patient   Patient Concerns Nutrition/Meal planning;Weight Control;Glycemic Control   Special Needs None   Preferred Learning Style  No preference indicated   Learning Readiness Ready   How often do you need to have someone help you when you read instructions, pamphlets, or other written materials from your doctor or pharmacy? 1 - Never   What is the last grade level you completed in school? 12 th grade     Pre-Education Assessment   Patient understands the diabetes disease and treatment process. Needs Instruction   Patient understands incorporating nutritional management into lifestyle. Needs Instruction   Patient undertands incorporating physical activity into lifestyle. Needs Instruction   Patient understands using medications safely. Needs Review   Patient understands monitoring blood glucose, interpreting and using results Needs Instruction   Patient understands prevention, detection, and treatment of acute complications. Needs Instruction   Patient understands prevention, detection, and treatment of chronic complications. Needs Instruction   Patient understands how to develop strategies to address psychosocial issues. Needs Instruction   Patient understands how to develop strategies to promote health/change behavior. Needs Instruction     Complications   Last HgB A1C per patient/outside source 7.9 %  04/02/16 increased from 6.4% on year prior   How often do you check your blood sugar? 1-2 times/day   Fasting Blood glucose range (mg/dL) 130-179   Postprandial Blood glucose  range (mg/dL) 180-200;>200   Number of hyperglycemic episodes per week 10   Can you tell when your blood sugar is high? Yes   What do you do if your blood sugar is high? eats limes    Have you had a dilated eye exam in the past 12 months? No   Have you had a dental exam in the past 12 months? Yes   Are you checking your feet? No     Dietary Intake   Breakfast flat bread, feta cheese, almonds, almonds, cucumbers, canteloupe occasionally, date  10   Snack (morning) none   Lunch rice, meat, vegetables  3   Snack (afternoon) nuts or fruit    Dinner cereal, yogurt, fruit OR crackers, cheese, olives  7   Snack (evening) none   Beverage(s) water, OJ, hot tea     Exercise   Exercise Type Light (walking / raking leaves)  used to walk but now too hot   How many days per week to you exercise? 0   How many minutes per day do you exercise? 0   Total minutes per week of exercise 0     Patient Education   Previous Diabetes Education No   Disease state  Definition of diabetes, type 1 and 2, and the diagnosis of diabetes;Factors that contribute to the development of diabetes;Explored patient's options for treatment of their diabetes   Nutrition management  Role of diet in the treatment of diabetes and the relationship between the three main macronutrients and blood glucose level;Food label reading, portion sizes and measuring food.;Information on hints to eating out and maintain blood glucose control.;Meal options for control of blood glucose level and chronic complications.   Physical activity and exercise  Role of exercise on diabetes management, blood pressure control and cardiac health.   Medications Reviewed patients medication for diabetes, action, purpose, timing of dose and side effects.   Monitoring Daily foot exams;Identified appropriate SMBG and/or A1C goals.;Yearly dilated eye exam   Chronic complications Relationship between chronic complications and blood glucose control;Dental care;Assessed and discussed foot care and prevention of foot problems;Retinopathy and reason for yearly dilated eye exams   Psychosocial adjustment Role of stress on diabetes;Worked with patient to identify barriers to care and solutions   Personal strategies to promote health Review risk of smoking and offered smoking cessation     Individualized Goals (developed by patient)   Nutrition Follow meal plan discussed;General guidelines for healthy choices and portions discussed   Physical Activity Exercise 5-7 days per week;30 minutes per day   Medications  take my medication as prescribed   Monitoring  test my blood glucose as discussed   Reducing Risk stop smoking;examine blood glucose patterns;do foot checks daily   Health Coping discuss diabetes with (comment)  MD/RD     Post-Education Assessment   Patient understands the diabetes disease and treatment process. Demonstrates understanding / competency   Patient understands incorporating nutritional management into lifestyle. Demonstrates understanding / competency   Patient undertands incorporating physical activity into lifestyle. Demonstrates understanding / competency   Patient understands using medications safely. Demonstrates understanding / competency   Patient understands monitoring blood glucose, interpreting and using results Demonstrates understanding / competency   Patient understands prevention, detection, and treatment of acute complications. Demonstrates understanding / competency   Patient understands prevention, detection, and treatment of chronic complications. Demonstrates understanding / competency   Patient understands how to develop strategies to address psychosocial issues. Demonstrates understanding / competency   Patient understands how  to develop strategies to promote health/change behavior. Demonstrates understanding / competency     Outcomes   Expected Outcomes Demonstrated interest in learning. Expect positive outcomes   Future DMSE PRN   Program Status Completed      Individualized Plan for Diabetes Self-Management Training:   Learning Objective:  Patient will have a greater understanding of diabetes self-management. Patient education plan is to attend individual and/or group sessions per assessed needs and concerns.   Plan:   Patient Instructions  Be as active as possible. Be mindful of your portion sizes.  Aim for 3 Carb Choices per meal (45 grams) +/- 1 either way  Aim for 0-1 Carbs per snack if hungry  Include protein in moderation with your meals  and snacks Consider reading food labels for Total Carbohydrate and Fat Grams of foods Consider  increasing your activity level by walking, swimming for 30 minutes daily as tolerated Consider checking BG at alternate times per day as directed by MD  Consider taking medication as directed by MD      Expected Outcomes:  Demonstrated interest in learning. Expect positive outcomes  Education material provided: Living Well with Diabetes, Food label handouts, A1C conversion sheet, Meal plan card, My Plate and Snack sheet, label reading  If problems or questions, patient to contact team via:  Phone and Email  Future DSME appointment: PRN

## 2016-07-02 DIAGNOSIS — H04123 Dry eye syndrome of bilateral lacrimal glands: Secondary | ICD-10-CM | POA: Diagnosis not present

## 2016-07-02 DIAGNOSIS — H2513 Age-related nuclear cataract, bilateral: Secondary | ICD-10-CM | POA: Diagnosis not present

## 2016-07-02 DIAGNOSIS — H524 Presbyopia: Secondary | ICD-10-CM | POA: Diagnosis not present

## 2016-08-31 ENCOUNTER — Other Ambulatory Visit: Payer: Self-pay | Admitting: Family Medicine

## 2016-08-31 ENCOUNTER — Ambulatory Visit
Admission: RE | Admit: 2016-08-31 | Discharge: 2016-08-31 | Disposition: A | Discharge status: Home / Self Care | Payer: Medicare Other | Source: Ambulatory Visit | Attending: Family Medicine | Admitting: Family Medicine

## 2016-08-31 DIAGNOSIS — W19XXXA Unspecified fall, initial encounter: Secondary | ICD-10-CM

## 2016-08-31 DIAGNOSIS — M533 Sacrococcygeal disorders, not elsewhere classified: Secondary | ICD-10-CM | POA: Diagnosis not present

## 2016-08-31 DIAGNOSIS — M545 Low back pain: Secondary | ICD-10-CM | POA: Diagnosis not present

## 2016-08-31 DIAGNOSIS — L309 Dermatitis, unspecified: Secondary | ICD-10-CM | POA: Diagnosis not present

## 2016-08-31 DIAGNOSIS — Z7984 Long term (current) use of oral hypoglycemic drugs: Secondary | ICD-10-CM | POA: Diagnosis not present

## 2016-08-31 DIAGNOSIS — E119 Type 2 diabetes mellitus without complications: Secondary | ICD-10-CM | POA: Diagnosis not present

## 2016-09-25 DIAGNOSIS — F17200 Nicotine dependence, unspecified, uncomplicated: Secondary | ICD-10-CM | POA: Diagnosis not present

## 2016-09-25 DIAGNOSIS — R141 Gas pain: Secondary | ICD-10-CM | POA: Diagnosis not present

## 2016-09-25 DIAGNOSIS — N951 Menopausal and female climacteric states: Secondary | ICD-10-CM | POA: Diagnosis not present

## 2016-09-25 DIAGNOSIS — K649 Unspecified hemorrhoids: Secondary | ICD-10-CM | POA: Diagnosis not present

## 2016-09-25 DIAGNOSIS — E1122 Type 2 diabetes mellitus with diabetic chronic kidney disease: Secondary | ICD-10-CM | POA: Diagnosis not present

## 2016-09-25 DIAGNOSIS — H04129 Dry eye syndrome of unspecified lacrimal gland: Secondary | ICD-10-CM | POA: Diagnosis not present

## 2016-09-25 DIAGNOSIS — N182 Chronic kidney disease, stage 2 (mild): Secondary | ICD-10-CM | POA: Diagnosis not present

## 2016-09-25 DIAGNOSIS — M5126 Other intervertebral disc displacement, lumbar region: Secondary | ICD-10-CM | POA: Diagnosis not present

## 2016-10-01 DIAGNOSIS — Z794 Long term (current) use of insulin: Secondary | ICD-10-CM | POA: Diagnosis not present

## 2016-10-01 DIAGNOSIS — E1122 Type 2 diabetes mellitus with diabetic chronic kidney disease: Secondary | ICD-10-CM | POA: Diagnosis not present

## 2016-10-01 DIAGNOSIS — F17200 Nicotine dependence, unspecified, uncomplicated: Secondary | ICD-10-CM | POA: Diagnosis not present

## 2016-10-01 DIAGNOSIS — E7800 Pure hypercholesterolemia, unspecified: Secondary | ICD-10-CM | POA: Diagnosis not present

## 2016-10-15 DIAGNOSIS — K6289 Other specified diseases of anus and rectum: Secondary | ICD-10-CM | POA: Diagnosis not present

## 2016-11-11 DIAGNOSIS — E119 Type 2 diabetes mellitus without complications: Secondary | ICD-10-CM | POA: Diagnosis not present

## 2016-11-11 DIAGNOSIS — R51 Headache: Secondary | ICD-10-CM | POA: Diagnosis not present

## 2016-11-11 DIAGNOSIS — R42 Dizziness and giddiness: Secondary | ICD-10-CM | POA: Diagnosis not present

## 2016-12-04 DIAGNOSIS — Z1211 Encounter for screening for malignant neoplasm of colon: Secondary | ICD-10-CM | POA: Diagnosis not present

## 2016-12-04 DIAGNOSIS — R42 Dizziness and giddiness: Secondary | ICD-10-CM | POA: Diagnosis not present

## 2016-12-04 DIAGNOSIS — K649 Unspecified hemorrhoids: Secondary | ICD-10-CM | POA: Diagnosis not present

## 2016-12-17 DIAGNOSIS — R6889 Other general symptoms and signs: Secondary | ICD-10-CM | POA: Diagnosis not present

## 2016-12-17 DIAGNOSIS — J101 Influenza due to other identified influenza virus with other respiratory manifestations: Secondary | ICD-10-CM | POA: Diagnosis not present

## 2017-01-01 DIAGNOSIS — Z1211 Encounter for screening for malignant neoplasm of colon: Secondary | ICD-10-CM | POA: Diagnosis not present

## 2017-01-07 DIAGNOSIS — K219 Gastro-esophageal reflux disease without esophagitis: Secondary | ICD-10-CM | POA: Diagnosis not present

## 2017-02-28 DIAGNOSIS — F329 Major depressive disorder, single episode, unspecified: Secondary | ICD-10-CM | POA: Diagnosis not present

## 2017-02-28 DIAGNOSIS — G4700 Insomnia, unspecified: Secondary | ICD-10-CM | POA: Diagnosis not present

## 2017-02-28 DIAGNOSIS — K649 Unspecified hemorrhoids: Secondary | ICD-10-CM | POA: Diagnosis not present

## 2017-03-28 ENCOUNTER — Emergency Department (HOSPITAL_COMMUNITY)
Admission: EM | Admit: 2017-03-28 | Discharge: 2017-03-29 | Disposition: A | Discharge status: Home / Self Care | Payer: Medicare Other | Attending: Emergency Medicine | Admitting: Emergency Medicine

## 2017-03-28 ENCOUNTER — Emergency Department (HOSPITAL_COMMUNITY): Payer: Medicare Other

## 2017-03-28 ENCOUNTER — Encounter (HOSPITAL_COMMUNITY): Payer: Self-pay | Admitting: Emergency Medicine

## 2017-03-28 DIAGNOSIS — R072 Precordial pain: Secondary | ICD-10-CM | POA: Diagnosis not present

## 2017-03-28 DIAGNOSIS — R52 Pain, unspecified: Secondary | ICD-10-CM

## 2017-03-28 DIAGNOSIS — Z79899 Other long term (current) drug therapy: Secondary | ICD-10-CM | POA: Diagnosis not present

## 2017-03-28 DIAGNOSIS — E119 Type 2 diabetes mellitus without complications: Secondary | ICD-10-CM | POA: Diagnosis not present

## 2017-03-28 DIAGNOSIS — R0602 Shortness of breath: Secondary | ICD-10-CM | POA: Diagnosis not present

## 2017-03-28 DIAGNOSIS — F17210 Nicotine dependence, cigarettes, uncomplicated: Secondary | ICD-10-CM | POA: Diagnosis not present

## 2017-03-28 DIAGNOSIS — R079 Chest pain, unspecified: Secondary | ICD-10-CM | POA: Diagnosis not present

## 2017-03-28 DIAGNOSIS — R1013 Epigastric pain: Secondary | ICD-10-CM | POA: Diagnosis not present

## 2017-03-28 DIAGNOSIS — I10 Essential (primary) hypertension: Secondary | ICD-10-CM | POA: Diagnosis not present

## 2017-03-28 DIAGNOSIS — R0789 Other chest pain: Secondary | ICD-10-CM | POA: Diagnosis present

## 2017-03-28 HISTORY — DX: Type 2 diabetes mellitus without complications: E11.9

## 2017-03-28 LAB — BASIC METABOLIC PANEL
ANION GAP: 10 (ref 5–15)
BUN: 22 mg/dL — AB (ref 6–20)
CHLORIDE: 103 mmol/L (ref 101–111)
CO2: 25 mmol/L (ref 22–32)
Calcium: 9.8 mg/dL (ref 8.9–10.3)
Creatinine, Ser: 0.68 mg/dL (ref 0.44–1.00)
GFR calc Af Amer: >60 mL/min (ref >60)
GLUCOSE: 106 mg/dL — AB (ref 65–99)
POTASSIUM: 3.8 mmol/L (ref 3.5–5.1)
SODIUM: 138 mmol/L (ref 135–145)

## 2017-03-28 LAB — CBC
HCT: 47.6 % — ABNORMAL HIGH (ref 36.0–46.0)
HEMOGLOBIN: 15.8 g/dL — AB (ref 12.0–15.0)
MCH: 30 pg (ref 26.0–34.0)
MCHC: 33.2 g/dL (ref 30.0–36.0)
MCV: 90.5 fL (ref 78.0–100.0)
Platelets: 236 10*3/uL (ref 150–400)
RBC: 5.26 MIL/uL — ABNORMAL HIGH (ref 3.87–5.11)
RDW: 13.4 % (ref 11.5–15.5)
WBC: 9.2 10*3/uL (ref 4.0–10.5)

## 2017-03-28 LAB — I-STAT TROPONIN, ED: Troponin i, poc: 0 ng/mL (ref 0.00–0.08)

## 2017-03-28 MED ORDER — GI COCKTAIL ~~LOC~~
30.0000 mL | Freq: Once | ORAL | Status: AC
Start: 2017-03-29 — End: 2017-03-28
  Administered 2017-03-28: 30 mL via ORAL
  Filled 2017-03-28: qty 30

## 2017-03-28 NOTE — ED Triage Notes (Signed)
Pt states she started having chest pain yesterday with shortness of breath   Pt states the pain comes and goes  Pt states it feels like someone is squeezing her heart  Pt denies any other sxs like nausea, sweats, pain in back, shoulder or neck

## 2017-03-28 NOTE — ED Provider Notes (Signed)
[facility] DEPT Provider Note   CSN: [id number] Arrival date & time: 03/28/17  2046  By signing my name below, I, Margit Banda, attest that this documentation has been prepared under the direction and in the presence of Oakes, Francenia Chimenti, MD. Electronically Signed: Margit Banda, ED Scribe. 03/28/17. 11:45 PM.  History   Chief Complaint Chief Complaint  Patient presents with  . Chest Pain  . Shortness of Breath    HPI Debra Morrison is a 61 y.o. female who presents to the Emergency Department complaining of intermittent heavy, pressure -like CP that started yesterday. Associated sx include SOB, burping, dry mouth and HA. Pt reports pain lasts for ~ 1 hour each time. No modifying factors. Nothing tried at home for pain. No recent travel. Pt denies bilateral leg edema, cough, congestion, wheezing, nausea, vomiting, diarrhea, abdominal pain, and back pain.  The history is provided by the patient. No language interpreter was used.  Chest Pain   This is a new problem. The current episode started yesterday. The problem occurs constantly. The problem has not changed since onset.The pain is associated with rest. The pain is present in the epigastric region. The pain is mild. The quality of the pain is described as pressure-like. The pain does not radiate. Duration of episode(s) is 1 hour. Associated symptoms include abdominal pain (Epigastric and LUQ tenderness.). Pertinent negatives include no back pain, no cough, no diaphoresis, no lower extremity edema, no nausea, no palpitations, no shortness of breath and no vomiting. Risk factors include being elderly.  Her past medical history is significant for diabetes and hypertension.  Pertinent negatives for past medical history include no Marfan's syndrome.  Procedure history is negative for stress echo.  Shortness of Breath  Associated symptoms include chest pain and abdominal pain (Epigastric and LUQ tenderness.). Pertinent negatives include no  cough, no wheezing, no vomiting and no leg swelling.    Past Medical History:  Diagnosis Date  . Diabetes mellitus without complication ([place])   . High cholesterol   . Hypertension     Patient Active Problem List   Diagnosis Date Noted  . HYPERLIPIDEMIA 01/19/2010  . OBESITY 01/19/2010  . TOBACCO USE 01/19/2010  . OSTEOPENIA 01/19/2010    History reviewed. No pertinent surgical history.  OB History    No data available       Home Medications    Prior to Admission medications   Medication Sig Start Date End Date Taking? Authorizing Provider  amLODipine (NORVASC) 5 MG tablet Take 5 mg by mouth daily.    [provider]  atorvastatin (LIPITOR) 40 MG tablet Take 40 mg by mouth every other day. 03/26/14   [provider]  calcium carbonate (OS-CAL) 600 MG tablet Take 600 mg by mouth daily.    [provider]  Canagliflozin-Metformin HCl (INVOKAMET) 5160965541 MG TABS Take 150-1,000 mg by mouth daily. 04/26/16   [provider]  chlorpheniramine-HYDROcodone (TUSSIONEX) 10-8 MG/5ML LQCR Take 5 mLs by mouth once. Patient not taking: Reported on 06/05/2016 03/09/14   Charlann Lange, PA-C  cholecalciferol (VITAMIN D) 1000 units tablet Take 1,000 Units by mouth daily.    [provider]  famotidine (PEPCID) 20 MG tablet Take 1 tablet (20 mg total) by mouth 2 (two) times daily as needed for heartburn or indigestion. 01/04/14   Drenda Freeze, MD  Multiple Vitamin (MULTIVITAMIN WITH MINERALS) TABS tablet Take 1 tablet by mouth daily.    [provider]  naproxen (NAPROSYN) 500 MG tablet Take 500 mg  by mouth 2 (two) times daily as needed for mild pain.    [provider]  Omega-3 1000 MG CAPS Take 1,000 mg by mouth daily.    [provider]  Pseudoeph-Doxylamine-DM-APAP (NYQUIL PO) Take 1-2 capsules by mouth at bedtime as needed (cough, cold symptoms).     [provider]  Pseudoephedrine-APAP-DM (DAYQUIL PO)  Take 1-2 capsules by mouth 2 (two) times daily as needed (cough, cold symptoms).     [provider]  zolpidem (AMBIEN) 5 MG tablet Take 5 mg by mouth at bedtime as needed. 10/22/14   [provider]    Family History Family History  Problem Relation Age of Onset  . Hypertension Mother     Social History Social History  Substance Use Topics  . Smoking status: Current Every Day Smoker    Packs/day: 0.50    Types: Cigarettes  . Smokeless tobacco: Never Used  . Alcohol use No     Comment: former     Allergies   Boniva [ibandronic acid]   Review of Systems Review of Systems  Constitutional: Negative for diaphoresis.  HENT: Negative for congestion.   Respiratory: Negative for cough, shortness of breath and wheezing.   Cardiovascular: Positive for chest pain. Negative for palpitations and leg swelling.  Gastrointestinal: Positive for abdominal pain (Epigastric and LUQ tenderness.). Negative for diarrhea, nausea and vomiting.  Musculoskeletal: Negative for back pain.  All other systems reviewed and are negative.    Physical Exam Updated Vital Signs BP (!) 152/82 (BP Location: Left Arm)   Pulse 69   Temp 98 F (36.7 C) (Oral)   Resp 18   SpO2 100%   Physical Exam  Constitutional: She appears well-developed and well-nourished.  HENT:  Head: Normocephalic.  Mouth/Throat: Oropharynx is clear and moist. No oropharyngeal exudate.  Eyes: Conjunctivae and EOM are normal. Pupils are equal, round, and reactive to light. Right eye exhibits no discharge. Left eye exhibits no discharge. No scleral icterus.  Neck: Normal range of motion. Neck supple. No JVD present. No tracheal deviation present.  Trachea is midline. No stridor or carotid bruits.   Cardiovascular: Normal rate, regular rhythm, normal heart sounds and intact distal pulses.   No murmur heard. Pulmonary/Chest: Effort normal and breath sounds normal. No stridor. No respiratory distress. She has no  wheezes. She has no rales.  Lungs CTA bilaterally.  Abdominal: Soft. Bowel sounds are normal. She exhibits no distension. There is no tenderness. There is no rebound and no guarding.  Musculoskeletal: Normal range of motion. She exhibits no edema or tenderness.  All compartments are soft. No palpable cords.   Lymphadenopathy:    She has no cervical adenopathy.  Neurological: She is alert. She has normal reflexes. She displays normal reflexes. She exhibits normal muscle tone.  Skin: Skin is warm and dry. Capillary refill takes less than 2 seconds.  Psychiatric: She has a normal mood and affect. Her behavior is normal.  Nursing note and vitals reviewed.    ED Treatments / Results  DIAGNOSTIC STUDIES: Oxygen Saturation is 100% on RA, normal by my interpretation.   COORDINATION OF CARE: 11:45 PM-Discussed next steps with pt. Pt verbalized understanding and is agreeable with the plan.    Labs (all labs ordered are listed, but only abnormal results are displayed) Results for orders placed or performed during the hospital encounter of 89/38/10  Basic metabolic panel  Result Value Ref Range   Sodium 138 135 - 145 mmol/L   Potassium 3.8 3.5 -  5.1 mmol/L   Chloride 103 101 - 111 mmol/L   CO2 25 22 - 32 mmol/L   Glucose, Bld 106 (H) 65 - 99 mg/dL   BUN 22 (H) 6 - 20 mg/dL   Creatinine, Ser 0.68 0.44 - 1.00 mg/dL   Calcium 9.8 8.9 - 10.3 mg/dL   GFR calc non Af Amer >60 >60 mL/min   GFR calc Af Amer >60 >60 mL/min   Anion gap 10 5 - 15  CBC  Result Value Ref Range   WBC 9.2 4.0 - 10.5 K/uL   RBC 5.26 (H) 3.87 - 5.11 MIL/uL   Hemoglobin 15.8 (H) 12.0 - 15.0 g/dL   HCT 47.6 (H) 36.0 - 46.0 %   MCV 90.5 78.0 - 100.0 fL   MCH 30.0 26.0 - 34.0 pg   MCHC 33.2 30.0 - 36.0 g/dL   RDW 13.4 11.5 - 15.5 %   Platelets 236 150 - 400 K/uL  I-stat troponin, ED  Result Value Ref Range   Troponin i, poc 0.00 0.00 - 0.08 ng/mL   Comment 3          I-stat troponin, ED  Result Value Ref Range    Troponin i, poc 0.00 0.00 - 0.08 ng/mL   Comment 3           Dg Chest 2 View  Result Date: 03/28/2017 CLINICAL DATA:  Pt states she started having left chest pain yesterday with shortness of breath Pt states the pain comes and goes Pt states it feels like someone is squeezing her heart. Diabetic, Hx HTN, Smoker. EXAM: CHEST  2 VIEW COMPARISON:  03/09/2014 FINDINGS: The heart size and mediastinal contours are within normal limits. Both lungs are clear. No pleural effusion or pneumothorax. The visualized skeletal structures are unremarkable. IMPRESSION: No active cardiopulmonary disease. Electronically Signed   By: Lajean Manes M.D.   On: 03/28/2017 21:46   Ct Angio Chest Pe W Or Wo Contrast  Result Date: 03/29/2017 CLINICAL DATA:  Chest pain. EXAM: CT ANGIOGRAPHY CHEST WITH CONTRAST TECHNIQUE: Multidetector CT imaging of the chest was performed using the standard protocol during bolus administration of intravenous contrast. Multiplanar CT image reconstructions and MIPs were obtained to evaluate the vascular anatomy. CONTRAST:  100 cc Isovue 370 IV COMPARISON:  Radiographs yesterday.  Chest CT 09/22/2009 FINDINGS: Cardiovascular: There are no filling defects within the pulmonary arteries to suggest pulmonary embolus. Thoracic aorta is normal in caliber without aneurysm. Scattered coronary artery calcifications are seen. The heart is normal in size. Mediastinum/Nodes: No mediastinal, hilar, or axillary adenopathy. The esophagus is decompressed. No pericardial fluid. Visualized thyroid gland is normal. Lungs/Pleura: No consolidation, pulmonary edema or pleural fluid. Mild scattered atelectasis. Trachea mainstem bronchi are patent. No pulmonary mass or suspicious nodule. Upper Abdomen: No acute abnormality. Musculoskeletal: There are no acute or suspicious osseous abnormalities. Review of the MIP images confirms the above findings. IMPRESSION: 1. No pulmonary embolus or acute intrathoracic abnormality. 2.  Scattered coronary artery calcifications. Electronically Signed   By: Jeb Levering M.D.   On: 03/29/2017 01:16    EKG  EKG Interpretation  Date/Time:  Thursday Mar 28 2017 20:52:52 EDT Ventricular Rate:  72 PR Interval:    QRS Duration: 90 QT Interval:  403 QTC Calculation: 441 R Axis:   63 Text Interpretation:  Sinus rhythm Confirmed by Dory Horn) on 03/28/2017 11:26:10 PM       Radiology Dg Chest 2 View  Result Date: 03/28/2017 CLINICAL DATA:  Pt states  she started having left chest pain yesterday with shortness of breath Pt states the pain comes and goes Pt states it feels like someone is squeezing her heart. Diabetic, Hx HTN, Smoker. EXAM: CHEST  2 VIEW COMPARISON:  03/09/2014 FINDINGS: The heart size and mediastinal contours are within normal limits. Both lungs are clear. No pleural effusion or pneumothorax. The visualized skeletal structures are unremarkable. IMPRESSION: No active cardiopulmonary disease. Electronically Signed   By: Lajean Manes M.D.   On: 03/28/2017 21:46    Procedures Procedures (including critical care time)  Medications Ordered in ED Medications  gi cocktail (Maalox,Lidocaine,Donnatal) (30 mLs Oral Given 03/28/17 2358)  iopamidol (ISOVUE-370) 76 % injection 100 mL (100 mLs Intravenous Contrast Given 03/29/17 0045)     Final Clinical Impressions(s) / ED Diagnoses  Ruled out for MI in the ED heart score is 1 low risk for MACE.  Ruled out for PE in the ED.  I highly suspect this is GERD as it is in the LUQ and epigastrum and there is belching.  Will start pepcid and have patient follow up with PMD.  The patient is nontoxic-appearing on exam and vital signs are within normal limits.  Return for fever, chest pain on exertion, shortness of breath, shoulder pain, sweatiness, passing out, vomiting, weakness, lethargy, leg swelling or any concerns.   I have reviewed the triage vital signs and the nursing notes. Pertinent labs &imaging results that  were available during my care of the patient were reviewed by me and considered in my medical decision making (see chart for details). The patient is nontoxic-appearing on exam and vital signs are within normal limits. Return for fevers, chest pain with exertion, weakness, numbness, neck pain or stiffness, inability to make or understand speech or any concerns.   After history, exam, and medical workup I feel the patient has been appropriately medically screened and is safe for discharge home. Pertinent diagnoses were discussed with the patient. Patient was given return precautions.   I personally performed the services described in this documentation, which was scribed in my presence. The recorded information has been reviewed and is accurate.        Alise Calais, MD 03/29/17 [phone]

## 2017-03-29 ENCOUNTER — Emergency Department (HOSPITAL_COMMUNITY): Payer: Medicare Other

## 2017-03-29 ENCOUNTER — Encounter (HOSPITAL_COMMUNITY): Payer: Self-pay

## 2017-03-29 DIAGNOSIS — R079 Chest pain, unspecified: Secondary | ICD-10-CM | POA: Diagnosis not present

## 2017-03-29 DIAGNOSIS — R072 Precordial pain: Secondary | ICD-10-CM | POA: Diagnosis not present

## 2017-03-29 LAB — I-STAT TROPONIN, ED: Troponin i, poc: 0 ng/mL (ref 0.00–0.08)

## 2017-03-29 MED ORDER — FAMOTIDINE 20 MG PO TABS: 20.0000 mg | ORAL_TABLET | Freq: Two times a day (BID) | ORAL | 0 refills | Status: DC

## 2017-03-29 MED ORDER — IOPAMIDOL (ISOVUE-370) INJECTION 76%
INTRAVENOUS | Status: AC
  Administered 2017-03-29: 100 mL via INTRAVENOUS
  Filled 2017-03-29: qty 100

## 2017-03-29 MED ORDER — IOPAMIDOL (ISOVUE-370) INJECTION 76%
100.0000 mL | Freq: Once | INTRAVENOUS | Status: AC | PRN
  Administered 2017-03-29: 100 mL via INTRAVENOUS

## 2017-04-19 ENCOUNTER — Other Ambulatory Visit: Payer: Self-pay | Admitting: Family Medicine

## 2017-04-19 ENCOUNTER — Telehealth: Payer: Self-pay

## 2017-04-19 ENCOUNTER — Other Ambulatory Visit (HOSPITAL_COMMUNITY)
Admission: RE | Admit: 2017-04-19 | Discharge: 2017-04-19 | Disposition: A | Discharge status: Home / Self Care | Payer: Medicare Other | Source: Ambulatory Visit | Attending: Family Medicine | Admitting: Family Medicine

## 2017-04-19 DIAGNOSIS — E1122 Type 2 diabetes mellitus with diabetic chronic kidney disease: Secondary | ICD-10-CM | POA: Diagnosis not present

## 2017-04-19 DIAGNOSIS — Z0000 Encounter for general adult medical examination without abnormal findings: Secondary | ICD-10-CM | POA: Diagnosis not present

## 2017-04-19 DIAGNOSIS — I10 Essential (primary) hypertension: Secondary | ICD-10-CM | POA: Diagnosis not present

## 2017-04-19 DIAGNOSIS — I2510 Atherosclerotic heart disease of native coronary artery without angina pectoris: Secondary | ICD-10-CM | POA: Diagnosis not present

## 2017-04-19 DIAGNOSIS — G4700 Insomnia, unspecified: Secondary | ICD-10-CM | POA: Diagnosis not present

## 2017-04-19 DIAGNOSIS — E559 Vitamin D deficiency, unspecified: Secondary | ICD-10-CM | POA: Diagnosis not present

## 2017-04-19 DIAGNOSIS — N182 Chronic kidney disease, stage 2 (mild): Secondary | ICD-10-CM | POA: Diagnosis not present

## 2017-04-19 DIAGNOSIS — Z79899 Other long term (current) drug therapy: Secondary | ICD-10-CM | POA: Diagnosis not present

## 2017-04-19 DIAGNOSIS — Z01411 Encounter for gynecological examination (general) (routine) with abnormal findings: Secondary | ICD-10-CM | POA: Insufficient documentation

## 2017-04-19 DIAGNOSIS — Z6828 Body mass index (BMI) 28.0-28.9, adult: Secondary | ICD-10-CM | POA: Diagnosis not present

## 2017-04-19 NOTE — Telephone Encounter (Signed)
SENT NOTES TO SCHEDULING 

## 2017-04-22 LAB — CYTOLOGY - PAP: Diagnosis: NEGATIVE

## 2017-05-06 ENCOUNTER — Telehealth: Payer: Self-pay | Admitting: Cardiovascular Disease

## 2017-05-06 NOTE — Telephone Encounter (Signed)
Received records from [facility] for appointment on 05/13/17 with Dr Oval Linsey.  Records put with Dr Blenda Mounts schedule for 05/13/17. lp

## 2017-05-13 ENCOUNTER — Encounter: Payer: Self-pay | Admitting: Cardiovascular Disease

## 2017-05-13 ENCOUNTER — Ambulatory Visit (INDEPENDENT_AMBULATORY_CARE_PROVIDER_SITE_OTHER): Payer: Medicare Other | Admitting: Cardiovascular Disease

## 2017-05-13 VITALS — BP 122/70 | HR 96 | Ht 63.0 in | Wt 158.4 lb

## 2017-05-13 DIAGNOSIS — R0789 Other chest pain: Secondary | ICD-10-CM

## 2017-05-13 DIAGNOSIS — R079 Chest pain, unspecified: Secondary | ICD-10-CM | POA: Diagnosis not present

## 2017-05-13 DIAGNOSIS — F17210 Nicotine dependence, cigarettes, uncomplicated: Secondary | ICD-10-CM

## 2017-05-13 HISTORY — DX: Other chest pain: R07.89

## 2017-05-13 MED ORDER — BUPROPION HCL ER (SR) 150 MG PO TB12: ORAL_TABLET | ORAL | 2 refills | Status: DC

## 2017-05-13 NOTE — Patient Instructions (Addendum)
Medication Instructions:  START Wellbutrin SR (bupropion) is prescribed as 150 mg BID. Start for the first 3 days at one pill daily in the morning; if no side effects, take the second pill around 2PM to avoid insomnia. Call if any persisting side effects. TAKE FOR 12 WEEKS  Labwork: NONE  Testing/Procedures: Your physician has requested that you have a lexiscan myoview. For further information please visit [email or web address]. Please follow instruction sheet, as given.  Follow-Up: Your physician recommends that you schedule a follow-up appointment in: [place]  If you need a refill on your cardiac medications before your next appointment, please call your pharmacy.

## 2017-05-13 NOTE — Progress Notes (Addendum)
Cardiology Office Note   Date:  05/13/2017   ID:  Debra Morrison, DOB 1956/06/04, MRN [id number]  PCP:  Jonathon Jordan, MD  Cardiologist:   Skeet Latch, MD   No chief complaint on file.     History of Present Illness: Debra Morrison is a 61 y.o. female with hyperlipidemia, hypertension, diabetes and tobacco who is being seen today for the evaluation of chest pain at the request of Jonathon Jordan, MD.  Debra Morrison was seen in the ED 03/2016 with chest pain.  She reported constant chest pain for the preceding day.  Cardiac enzymes were negative 2.  Her EKG was unremarkable. She had a CT-A of the chest That was negative for pulmonary embolism. There were scattered calcifications of the coronary arteries.  She notes that for the last two months she has experienced intermittent chest pressure.  It feels like there is a heavy weight sitting on her chest. This is associated with shortness of breath and she feels that she cannot take a deep breath. There is no nausea but she does report diaphoresis. The pain does not radiate. Each episode lasts for a couple hours. There is no clear precipitating factors. It occurs randomly. She walks for exercise approximately 30 minutes 2-3 times per week. She has no exertional symptoms. She notes mild lower extremity edema if her legs have been hanging for a long period of time. This improves with elevation. She denies orthopnea or PND.  Debra Morrison has been smoking 1/2 ppd for 20 years.  She was able to quit for 3 months.  She did this cold [place].  She wanted to try Chantix but was advised against it due to her depression.  She also could not tolerate patches or gum.  The electronic cigarette made her nauseous.  She notes that she has been very depressed.  She hasn't seen her children in 20 years.  They live in [place].  She doesn't feel like going out and doesn't have many friends locally.  She was much more social in the past.    Past Medical History:    Diagnosis Date  . Atypical chest pain 05/13/2017  . Diabetes mellitus without complication ([place])   . High cholesterol   . Hypertension     No past surgical history on file.   Current Outpatient Prescriptions  Medication Sig Dispense Refill  . atorvastatin (LIPITOR) 40 MG tablet Take 40 mg by mouth every other day.    . calcium carbonate (OS-CAL) 600 MG tablet Take 600 mg by mouth daily.    . Canagliflozin-Metformin HCl (INVOKAMET) [id number] MG TABS Take 150-1,000 mg by mouth daily.    . cholecalciferol (VITAMIN D) 1000 units tablet Take 1,000 Units by mouth daily.    . famotidine (PEPCID) 20 MG tablet Take 1 tablet (20 mg total) by mouth 2 (two) times daily. 30 tablet 0  . Multiple Vitamin (MULTIVITAMIN WITH MINERALS) TABS tablet Take 1 tablet by mouth daily.    . Omega-3 1000 MG CAPS Take 1,000 mg by mouth daily.    Debra Kitchen zolpidem (AMBIEN) 5 MG tablet Take 5 mg by mouth at bedtime as needed.    Debra Kitchen buPROPion (WELLBUTRIN SR) 150 MG 12 hr tablet TAKE 1 TABLET BY MOUTH FOR 3 DAYS AND THEN INCREASE TO 1 TWICE A DAY 60 tablet 2   No current facility-administered medications for this visit.     Allergies:   Boniva [ibandronic acid]; Hydrocodone-acetaminophen; Lexapro [escitalopram oxalate]; and Metformin    Social  History:  The patient  reports that she has been smoking Cigarettes.  She has been smoking about 0.50 packs per day. She has never used smokeless tobacco. She reports that she does not drink alcohol or use drugs.   Family History:  The patient's family history includes Heart attack in her maternal grandmother; Hypertension in her mother.    ROS:  Please see the history of present illness.   Otherwise, review of systems are positive for none.   All other systems are reviewed and negative.    PHYSICAL EXAM: VS:  BP 122/70   Pulse 96   Ht 5\' 3"  (1.6 m)   Wt 71.8 kg (158 lb 6.4 oz)   BMI 28.06 kg/m  , BMI Body mass index is 28.06 kg/m. GENERAL:  Well appearing HEENT:  Pupils  equal round and reactive, fundi not visualized, oral mucosa unremarkable NECK:  No jugular venous distention, waveform within normal limits, carotid upstroke brisk and symmetric, no bruits, no thyromegaly LYMPHATICS:  No cervical adenopathy LUNGS:  Clear to auscultation bilaterally HEART:  RRR.  PMI not displaced or sustained,S1 and S2 within normal limits, no S3, no S4, no clicks, no rubs, no murmurs ABD:  Flat, positive bowel sounds normal in frequency in pitch, no bruits, no rebound, no guarding, no midline pulsatile mass, no hepatomegaly, no splenomegaly EXT:  2 plus pulses throughout, no edema, no cyanosis no clubbing SKIN:  No rashes no nodules NEURO:  Cranial nerves II through XII grossly intact, motor grossly intact throughout PSYCH:  Cognitively intact, oriented to person place and time    EKG:  EKG is not ordered today. The ekg ordered 03/30/17 demonstrates sinus rhythm.  Rate 72 bpm.   Recent Labs: 03/28/2017: BUN 22; Creatinine, Ser 0.68; Hemoglobin 15.8; Platelets 236; Potassium 3.8; Sodium 138    Lipid Panel    Component Value Date/Time   CHOL 187 01/19/2010 1539   TRIG 153.0 (H) 01/19/2010 1539   HDL 48.80 01/19/2010 1539   CHOLHDL 4 01/19/2010 1539   VLDL 30.6 01/19/2010 1539   LDLCALC 108 (H) 01/19/2010 1539    04/19/17: Hemoglobin A1c 5.9% Sodium 139, potassium 4.0, BUN 18, creatinine 0.74 AST 16, ALT 15 Total cholesterol 185, triglycerides 165, HDL 46, LDL 106 TSH 1.38  Wt Readings from Last 3 Encounters:  05/13/17 71.8 kg (158 lb 6.4 oz)  06/05/16 75.8 kg (167 lb)  03/09/14 79.4 kg (175 lb)      ASSESSMENT AND PLAN:  # Atypical chest pain: Symptoms are atypical. However she has coronary artery calcifications on CT. We will get a Lexiscan Myoview to assess. She does not think that she will be able to exercise.  # Tobacco abuse: Debra Morrison is interested in trying to quit again.  She has depression, so Wellbutrin may be a good option.  We will try 150mg   daily x3 days followed by 150mg  bid for 12 weeks.  We discussed smoking cessation for 5 minutes.    Current medicines are reviewed at length with the patient today.  The patient does not have concerns regarding medicines.  The following changes have been made:  Start Wellbutrin  Labs/ tests ordered today include:   Orders Placed This Encounter  Procedures  . Myocardial Perfusion Imaging     Disposition:   FU with Bandon Sherwin C. Oval Linsey, MD, [facility] in 1 month    This note was written with the assistance of speech recognition software.  Please excuse any transcriptional errors.  Signed, Katrenia Alkins C. Oval Linsey,  MD, [facility]  05/13/2017 1:54 PM    [place]

## 2017-05-24 ENCOUNTER — Telehealth (HOSPITAL_COMMUNITY): Payer: Self-pay

## 2017-05-24 NOTE — Telephone Encounter (Signed)
Encounter complete. 

## 2017-05-29 ENCOUNTER — Ambulatory Visit (HOSPITAL_COMMUNITY)
Admission: RE | Admit: 2017-05-29 | Discharge: 2017-05-29 | Disposition: A | Discharge status: Home / Self Care | Payer: Medicare Other | Source: Ambulatory Visit | Attending: Cardiology | Admitting: Cardiology

## 2017-05-29 DIAGNOSIS — F17200 Nicotine dependence, unspecified, uncomplicated: Secondary | ICD-10-CM | POA: Insufficient documentation

## 2017-05-29 DIAGNOSIS — R079 Chest pain, unspecified: Secondary | ICD-10-CM | POA: Diagnosis not present

## 2017-05-29 DIAGNOSIS — Z8249 Family history of ischemic heart disease and other diseases of the circulatory system: Secondary | ICD-10-CM | POA: Diagnosis not present

## 2017-05-29 DIAGNOSIS — R0602 Shortness of breath: Secondary | ICD-10-CM | POA: Insufficient documentation

## 2017-05-29 DIAGNOSIS — E119 Type 2 diabetes mellitus without complications: Secondary | ICD-10-CM | POA: Insufficient documentation

## 2017-05-29 DIAGNOSIS — I10 Essential (primary) hypertension: Secondary | ICD-10-CM | POA: Diagnosis not present

## 2017-05-29 MED ORDER — TECHNETIUM TC 99M TETROFOSMIN IV KIT
9.6000 | PACK | Freq: Once | INTRAVENOUS | Status: AC | PRN
  Administered 2017-05-29: 9.6 via INTRAVENOUS
  Filled 2017-05-29: qty 10

## 2017-05-29 MED ORDER — REGADENOSON 0.4 MG/5ML IV SOLN
0.4000 mg | Freq: Once | INTRAVENOUS | Status: AC
  Administered 2017-05-29: 0.4 mg via INTRAVENOUS

## 2017-05-29 MED ORDER — AMINOPHYLLINE 25 MG/ML IV SOLN
75.0000 mg | Freq: Once | INTRAVENOUS | Status: AC
  Administered 2017-05-29: 75 mg via INTRAVENOUS

## 2017-05-29 MED ORDER — TECHNETIUM TC 99M TETROFOSMIN IV KIT
28.8000 | PACK | Freq: Once | INTRAVENOUS | Status: AC | PRN
  Administered 2017-05-29: 28.8 via INTRAVENOUS
  Filled 2017-05-29: qty 29

## 2017-05-30 LAB — MYOCARDIAL PERFUSION IMAGING
CHL CUP NUCLEAR SDS: 1
CHL CUP RESTING HR STRESS: 57 {beats}/min
CSEPPHR: 105 {beats}/min
LV dias vol: 67 mL (ref 46–106)
LVSYSVOL: 22 mL
SRS: 0
SSS: 1
TID: 1.24

## 2017-05-31 DIAGNOSIS — E119 Type 2 diabetes mellitus without complications: Secondary | ICD-10-CM | POA: Diagnosis not present

## 2017-05-31 DIAGNOSIS — H2513 Age-related nuclear cataract, bilateral: Secondary | ICD-10-CM | POA: Diagnosis not present

## 2017-05-31 DIAGNOSIS — H35363 Drusen (degenerative) of macula, bilateral: Secondary | ICD-10-CM | POA: Diagnosis not present

## 2017-05-31 DIAGNOSIS — H25013 Cortical age-related cataract, bilateral: Secondary | ICD-10-CM | POA: Diagnosis not present

## 2017-06-27 ENCOUNTER — Ambulatory Visit: Payer: Medicare Other | Admitting: Cardiovascular Disease

## 2017-06-27 NOTE — Progress Notes (Deleted)
Cardiology Office Note   Date:  06/27/2017   ID:  Debra Morrison, DOB 12-28-1955, MRN [id number]  PCP:  Jonathon Jordan, MD  Cardiologist:   Skeet Latch, MD   No chief complaint on file.     History of Present Illness: Debra Morrison is a 61 y.o. female with hyperlipidemia, hypertension, diabetes and tobacco abuse here for follow up.   Debra Morrison was seen in the ED 03/2016 with chest pain.  She reported constant chest pain for the preceding day.  Cardiac enzymes were negative 2.  Her EKG was unremarkable. She had a CT-A of the chest That was negative for pulmonary embolism. There were scattered calcifications of the coronary arteries.  At her last appointment Debra Morrison reported atypical chest pain. She was referred for [facility] 05/29/17 that revealed LVEF 66% and no ischemia. She was also interested in looking cessation so she was started on Wellbutrin.     Past Medical History:  Diagnosis Date  . Atypical chest pain 05/13/2017  . Diabetes mellitus without complication ([place])   . High cholesterol   . Hypertension     No past surgical history on file.   Current Outpatient Prescriptions  Medication Sig Dispense Refill  . atorvastatin (LIPITOR) 40 MG tablet Take 40 mg by mouth every other day.    Marland Kitchen buPROPion (WELLBUTRIN SR) 150 MG 12 hr tablet TAKE 1 TABLET BY MOUTH FOR 3 DAYS AND THEN INCREASE TO 1 TWICE A DAY 60 tablet 2  . calcium carbonate (OS-CAL) 600 MG tablet Take 600 mg by mouth daily.    . Canagliflozin-Metformin HCl (INVOKAMET) [phone] MG TABS Take 150-1,000 mg by mouth daily.    . cholecalciferol (VITAMIN D) 1000 units tablet Take 1,000 Units by mouth daily.    . famotidine (PEPCID) 20 MG tablet Take 1 tablet (20 mg total) by mouth 2 (two) times daily. 30 tablet 0  . Multiple Vitamin (MULTIVITAMIN WITH MINERALS) TABS tablet Take 1 tablet by mouth daily.    . Omega-3 1000 MG CAPS Take 1,000 mg by mouth daily.    Marland Kitchen zolpidem (AMBIEN) 5 MG tablet Take 5 mg by  mouth at bedtime as needed.     No current facility-administered medications for this visit.     Allergies:   Boniva [ibandronic acid]; Hydrocodone-acetaminophen; Lexapro [escitalopram oxalate]; and Metformin    Social History:  The patient  reports that she has been smoking Cigarettes.  She has been smoking about 0.50 packs per day. She has never used smokeless tobacco. She reports that she does not drink alcohol or use drugs.   Family History:  The patient's family history includes Heart attack in her maternal grandmother; Hypertension in her mother.    ROS:  Please see the history of present illness.   Otherwise, review of systems are positive for none.   All other systems are reviewed and negative.    PHYSICAL EXAM: VS:  There were no vitals taken for this visit. , BMI There is no height or weight on file to calculate BMI. GENERAL:  Well appearing HEENT:  Pupils equal round and reactive, fundi not visualized, oral mucosa unremarkable NECK:  No jugular venous distention, waveform within normal limits, carotid upstroke brisk and symmetric, no bruits, no thyromegaly LYMPHATICS:  No cervical adenopathy LUNGS:  Clear to auscultation bilaterally HEART:  RRR.  PMI not displaced or sustained,S1 and S2 within normal limits, no S3, no S4, no clicks, no rubs, no murmurs ABD:  Flat, positive bowel  sounds normal in frequency in pitch, no bruits, no rebound, no guarding, no midline pulsatile mass, no hepatomegaly, no splenomegaly EXT:  2 plus pulses throughout, no edema, no cyanosis no clubbing SKIN:  No rashes no nodules NEURO:  Cranial nerves II through XII grossly intact, motor grossly intact throughout PSYCH:  Cognitively intact, oriented to person place and time    EKG:  EKG is not ordered today. The ekg ordered 03/30/17 demonstrates sinus rhythm.  Rate 72 bpm.  Lexiscan Myoview 05/29/17:  The left ventricular ejection fraction is hyperdynamic (>65%).  Nuclear stress EF: 66%.  There  was no ST segment deviation noted during stress.  The study is normal.  This is a low risk study.  Recent Labs: 03/28/2017: BUN 22; Creatinine, Ser 0.68; Hemoglobin 15.8; Platelets 236; Potassium 3.8; Sodium 138    Lipid Panel    Component Value Date/Time   CHOL 187 01/19/2010 1539   TRIG 153.0 (H) 01/19/2010 1539   HDL 48.80 01/19/2010 1539   CHOLHDL 4 01/19/2010 1539   VLDL 30.6 01/19/2010 1539   LDLCALC 108 (H) 01/19/2010 1539    04/19/17: Hemoglobin A1c 5.9% Sodium 139, potassium 4.0, BUN 18, creatinine 0.74 AST 16, ALT 15 Total cholesterol 185, triglycerides 165, HDL 46, LDL 106 TSH 1.38  Wt Readings from Last 3 Encounters:  05/29/17 71.7 kg (158 lb)  05/13/17 71.8 kg (158 lb 6.4 oz)  06/05/16 75.8 kg (167 lb)      ASSESSMENT AND PLAN:  # Atypical chest pain: Symptoms are atypical. However she has coronary artery calcifications on CT. We will get a Lexiscan Myoview to assess. She does not think that she will be able to exercise.  # Tobacco abuse: Debra Morrison is interested in trying to quit again.  She has depression, so Wellbutrin may be a good option.  We will try 150mg  daily x3 days followed by 150mg  bid for 12 weeks.  We discussed smoking cessation for 5 minutes.    Current medicines are reviewed at length with the patient today.  The patient does not have concerns regarding medicines.  The following changes have been made:  Start Wellbutrin  Labs/ tests ordered today include:   No orders of the defined types were placed in this encounter.    Disposition:   FU with Tieara Flitton C. Oval Linsey, MD, [facility] in 1 month    This note was written with the assistance of speech recognition software.  Please excuse any transcriptional errors.  Signed, Keisuke Hollabaugh C. Oval Linsey, MD, [facility]  06/27/2017 8:36 AM    [place]

## 2017-09-05 DIAGNOSIS — B373 Candidiasis of vulva and vagina: Secondary | ICD-10-CM | POA: Diagnosis not present

## 2017-09-05 DIAGNOSIS — K047 Periapical abscess without sinus: Secondary | ICD-10-CM | POA: Diagnosis not present

## 2017-09-24 ENCOUNTER — Other Ambulatory Visit: Payer: Self-pay | Admitting: Family Medicine

## 2017-09-24 DIAGNOSIS — G4700 Insomnia, unspecified: Secondary | ICD-10-CM | POA: Diagnosis not present

## 2017-09-24 DIAGNOSIS — B351 Tinea unguium: Secondary | ICD-10-CM | POA: Diagnosis not present

## 2017-09-24 DIAGNOSIS — G4489 Other headache syndrome: Secondary | ICD-10-CM

## 2017-09-24 DIAGNOSIS — N760 Acute vaginitis: Secondary | ICD-10-CM | POA: Diagnosis not present

## 2017-09-24 DIAGNOSIS — E1122 Type 2 diabetes mellitus with diabetic chronic kidney disease: Secondary | ICD-10-CM | POA: Diagnosis not present

## 2017-09-24 DIAGNOSIS — R51 Headache: Secondary | ICD-10-CM | POA: Diagnosis not present

## 2017-10-01 ENCOUNTER — Ambulatory Visit
Admission: RE | Admit: 2017-10-01 | Discharge: 2017-10-01 | Disposition: A | Discharge status: Home / Self Care | Payer: Medicare Other | Source: Ambulatory Visit | Attending: Family Medicine | Admitting: Family Medicine

## 2017-10-01 DIAGNOSIS — G4489 Other headache syndrome: Secondary | ICD-10-CM

## 2017-10-01 DIAGNOSIS — R51 Headache: Secondary | ICD-10-CM | POA: Diagnosis not present

## 2017-12-10 DIAGNOSIS — E1122 Type 2 diabetes mellitus with diabetic chronic kidney disease: Secondary | ICD-10-CM | POA: Diagnosis not present

## 2017-12-10 DIAGNOSIS — N760 Acute vaginitis: Secondary | ICD-10-CM | POA: Diagnosis not present

## 2017-12-10 DIAGNOSIS — K219 Gastro-esophageal reflux disease without esophagitis: Secondary | ICD-10-CM | POA: Diagnosis not present

## 2017-12-10 DIAGNOSIS — Z1211 Encounter for screening for malignant neoplasm of colon: Secondary | ICD-10-CM | POA: Diagnosis not present

## 2017-12-10 DIAGNOSIS — Z794 Long term (current) use of insulin: Secondary | ICD-10-CM | POA: Diagnosis not present

## 2018-01-28 DIAGNOSIS — M25569 Pain in unspecified knee: Secondary | ICD-10-CM | POA: Diagnosis not present

## 2018-01-28 DIAGNOSIS — Z794 Long term (current) use of insulin: Secondary | ICD-10-CM | POA: Diagnosis not present

## 2018-01-28 DIAGNOSIS — E1122 Type 2 diabetes mellitus with diabetic chronic kidney disease: Secondary | ICD-10-CM | POA: Diagnosis not present

## 2018-01-28 DIAGNOSIS — G4700 Insomnia, unspecified: Secondary | ICD-10-CM | POA: Diagnosis not present

## 2018-01-28 DIAGNOSIS — N182 Chronic kidney disease, stage 2 (mild): Secondary | ICD-10-CM | POA: Diagnosis not present

## 2018-05-29 DIAGNOSIS — E1122 Type 2 diabetes mellitus with diabetic chronic kidney disease: Secondary | ICD-10-CM | POA: Diagnosis not present

## 2018-05-29 DIAGNOSIS — F322 Major depressive disorder, single episode, severe without psychotic features: Secondary | ICD-10-CM | POA: Diagnosis not present

## 2018-05-29 DIAGNOSIS — E559 Vitamin D deficiency, unspecified: Secondary | ICD-10-CM | POA: Diagnosis not present

## 2018-05-29 DIAGNOSIS — E7800 Pure hypercholesterolemia, unspecified: Secondary | ICD-10-CM | POA: Diagnosis not present

## 2018-05-29 DIAGNOSIS — Z0000 Encounter for general adult medical examination without abnormal findings: Secondary | ICD-10-CM | POA: Diagnosis not present

## 2018-05-29 DIAGNOSIS — I10 Essential (primary) hypertension: Secondary | ICD-10-CM | POA: Diagnosis not present

## 2018-05-29 DIAGNOSIS — Z1211 Encounter for screening for malignant neoplasm of colon: Secondary | ICD-10-CM | POA: Diagnosis not present

## 2018-05-29 DIAGNOSIS — N182 Chronic kidney disease, stage 2 (mild): Secondary | ICD-10-CM | POA: Diagnosis not present

## 2018-05-29 DIAGNOSIS — Z79899 Other long term (current) drug therapy: Secondary | ICD-10-CM | POA: Diagnosis not present

## 2018-06-02 DIAGNOSIS — E119 Type 2 diabetes mellitus without complications: Secondary | ICD-10-CM | POA: Diagnosis not present

## 2018-06-02 DIAGNOSIS — H35363 Drusen (degenerative) of macula, bilateral: Secondary | ICD-10-CM | POA: Diagnosis not present

## 2018-06-02 DIAGNOSIS — H2513 Age-related nuclear cataract, bilateral: Secondary | ICD-10-CM | POA: Diagnosis not present

## 2018-06-02 DIAGNOSIS — H25013 Cortical age-related cataract, bilateral: Secondary | ICD-10-CM | POA: Diagnosis not present

## 2018-06-11 DIAGNOSIS — Z1231 Encounter for screening mammogram for malignant neoplasm of breast: Secondary | ICD-10-CM | POA: Diagnosis not present

## 2018-06-27 DIAGNOSIS — G5601 Carpal tunnel syndrome, right upper limb: Secondary | ICD-10-CM | POA: Diagnosis not present

## 2018-06-27 DIAGNOSIS — N760 Acute vaginitis: Secondary | ICD-10-CM | POA: Diagnosis not present

## 2018-09-08 DIAGNOSIS — Z7984 Long term (current) use of oral hypoglycemic drugs: Secondary | ICD-10-CM | POA: Diagnosis not present

## 2018-09-08 DIAGNOSIS — N760 Acute vaginitis: Secondary | ICD-10-CM | POA: Diagnosis not present

## 2018-09-08 DIAGNOSIS — E669 Obesity, unspecified: Secondary | ICD-10-CM | POA: Diagnosis not present

## 2018-09-08 DIAGNOSIS — E1122 Type 2 diabetes mellitus with diabetic chronic kidney disease: Secondary | ICD-10-CM | POA: Diagnosis not present

## 2018-09-08 DIAGNOSIS — Z6830 Body mass index (BMI) 30.0-30.9, adult: Secondary | ICD-10-CM | POA: Diagnosis not present

## 2018-09-26 DIAGNOSIS — N760 Acute vaginitis: Secondary | ICD-10-CM | POA: Diagnosis not present

## 2018-09-26 DIAGNOSIS — E1122 Type 2 diabetes mellitus with diabetic chronic kidney disease: Secondary | ICD-10-CM | POA: Diagnosis not present

## 2018-09-26 DIAGNOSIS — Z7984 Long term (current) use of oral hypoglycemic drugs: Secondary | ICD-10-CM | POA: Diagnosis not present

## 2018-09-26 DIAGNOSIS — R0981 Nasal congestion: Secondary | ICD-10-CM | POA: Diagnosis not present

## 2018-09-26 DIAGNOSIS — G500 Trigeminal neuralgia: Secondary | ICD-10-CM | POA: Diagnosis not present

## 2018-10-03 DIAGNOSIS — Z794 Long term (current) use of insulin: Secondary | ICD-10-CM | POA: Diagnosis not present

## 2018-10-03 DIAGNOSIS — E1122 Type 2 diabetes mellitus with diabetic chronic kidney disease: Secondary | ICD-10-CM | POA: Diagnosis not present

## 2018-11-21 DIAGNOSIS — I10 Essential (primary) hypertension: Secondary | ICD-10-CM | POA: Diagnosis not present

## 2018-11-21 DIAGNOSIS — R69 Illness, unspecified: Secondary | ICD-10-CM | POA: Diagnosis not present

## 2018-11-21 DIAGNOSIS — N760 Acute vaginitis: Secondary | ICD-10-CM | POA: Diagnosis not present

## 2018-12-12 DIAGNOSIS — I10 Essential (primary) hypertension: Secondary | ICD-10-CM | POA: Diagnosis not present

## 2018-12-20 DIAGNOSIS — R69 Illness, unspecified: Secondary | ICD-10-CM | POA: Diagnosis not present

## 2019-03-03 DIAGNOSIS — N182 Chronic kidney disease, stage 2 (mild): Secondary | ICD-10-CM | POA: Diagnosis not present

## 2019-03-03 DIAGNOSIS — Z7984 Long term (current) use of oral hypoglycemic drugs: Secondary | ICD-10-CM | POA: Diagnosis not present

## 2019-03-03 DIAGNOSIS — E1122 Type 2 diabetes mellitus with diabetic chronic kidney disease: Secondary | ICD-10-CM | POA: Diagnosis not present

## 2019-03-03 DIAGNOSIS — R51 Headache: Secondary | ICD-10-CM | POA: Diagnosis not present

## 2019-03-03 DIAGNOSIS — I10 Essential (primary) hypertension: Secondary | ICD-10-CM | POA: Diagnosis not present

## 2019-03-03 DIAGNOSIS — J019 Acute sinusitis, unspecified: Secondary | ICD-10-CM | POA: Diagnosis not present

## 2019-03-03 DIAGNOSIS — R69 Illness, unspecified: Secondary | ICD-10-CM | POA: Diagnosis not present

## 2019-03-16 DIAGNOSIS — N182 Chronic kidney disease, stage 2 (mild): Secondary | ICD-10-CM | POA: Diagnosis not present

## 2019-03-16 DIAGNOSIS — N761 Subacute and chronic vaginitis: Secondary | ICD-10-CM | POA: Diagnosis not present

## 2019-03-16 DIAGNOSIS — E1122 Type 2 diabetes mellitus with diabetic chronic kidney disease: Secondary | ICD-10-CM | POA: Diagnosis not present

## 2019-03-16 DIAGNOSIS — R69 Illness, unspecified: Secondary | ICD-10-CM | POA: Diagnosis not present

## 2019-03-18 DIAGNOSIS — R69 Illness, unspecified: Secondary | ICD-10-CM | POA: Diagnosis not present

## 2019-03-24 DIAGNOSIS — B373 Candidiasis of vulva and vagina: Secondary | ICD-10-CM | POA: Diagnosis not present

## 2019-03-24 DIAGNOSIS — N760 Acute vaginitis: Secondary | ICD-10-CM | POA: Diagnosis not present

## 2019-04-23 DIAGNOSIS — R69 Illness, unspecified: Secondary | ICD-10-CM | POA: Diagnosis not present

## 2019-05-18 DIAGNOSIS — K146 Glossodynia: Secondary | ICD-10-CM | POA: Diagnosis not present

## 2019-05-18 DIAGNOSIS — Z972 Presence of dental prosthetic device (complete) (partial): Secondary | ICD-10-CM | POA: Diagnosis not present

## 2019-05-26 DIAGNOSIS — Z1159 Encounter for screening for other viral diseases: Secondary | ICD-10-CM | POA: Diagnosis not present

## 2019-05-26 DIAGNOSIS — Z20828 Contact with and (suspected) exposure to other viral communicable diseases: Secondary | ICD-10-CM | POA: Diagnosis not present

## 2019-05-26 DIAGNOSIS — J029 Acute pharyngitis, unspecified: Secondary | ICD-10-CM | POA: Diagnosis not present

## 2019-05-26 DIAGNOSIS — R05 Cough: Secondary | ICD-10-CM | POA: Diagnosis not present

## 2019-07-01 DIAGNOSIS — R69 Illness, unspecified: Secondary | ICD-10-CM | POA: Diagnosis not present

## 2019-07-14 DIAGNOSIS — Z79899 Other long term (current) drug therapy: Secondary | ICD-10-CM | POA: Diagnosis not present

## 2019-07-14 DIAGNOSIS — B370 Candidal stomatitis: Secondary | ICD-10-CM | POA: Diagnosis not present

## 2019-07-14 DIAGNOSIS — E1122 Type 2 diabetes mellitus with diabetic chronic kidney disease: Secondary | ICD-10-CM | POA: Diagnosis not present

## 2019-07-14 DIAGNOSIS — I10 Essential (primary) hypertension: Secondary | ICD-10-CM | POA: Diagnosis not present

## 2019-07-14 DIAGNOSIS — B379 Candidiasis, unspecified: Secondary | ICD-10-CM | POA: Diagnosis not present

## 2019-07-14 DIAGNOSIS — Z1211 Encounter for screening for malignant neoplasm of colon: Secondary | ICD-10-CM | POA: Diagnosis not present

## 2019-07-14 DIAGNOSIS — E559 Vitamin D deficiency, unspecified: Secondary | ICD-10-CM | POA: Diagnosis not present

## 2019-07-14 DIAGNOSIS — L819 Disorder of pigmentation, unspecified: Secondary | ICD-10-CM | POA: Diagnosis not present

## 2019-07-14 DIAGNOSIS — N182 Chronic kidney disease, stage 2 (mild): Secondary | ICD-10-CM | POA: Diagnosis not present

## 2019-07-14 DIAGNOSIS — Z0000 Encounter for general adult medical examination without abnormal findings: Secondary | ICD-10-CM | POA: Diagnosis not present

## 2019-07-17 DIAGNOSIS — R69 Illness, unspecified: Secondary | ICD-10-CM | POA: Diagnosis not present

## 2019-08-17 DIAGNOSIS — R69 Illness, unspecified: Secondary | ICD-10-CM | POA: Diagnosis not present

## 2019-09-01 DIAGNOSIS — R69 Illness, unspecified: Secondary | ICD-10-CM | POA: Diagnosis not present

## 2019-09-03 DIAGNOSIS — R519 Headache, unspecified: Secondary | ICD-10-CM | POA: Diagnosis not present

## 2019-09-03 DIAGNOSIS — H8110 Benign paroxysmal vertigo, unspecified ear: Secondary | ICD-10-CM | POA: Diagnosis not present

## 2019-09-29 DIAGNOSIS — R69 Illness, unspecified: Secondary | ICD-10-CM | POA: Diagnosis not present

## 2019-10-06 DIAGNOSIS — H25013 Cortical age-related cataract, bilateral: Secondary | ICD-10-CM | POA: Diagnosis not present

## 2019-10-06 DIAGNOSIS — E119 Type 2 diabetes mellitus without complications: Secondary | ICD-10-CM | POA: Diagnosis not present

## 2019-10-06 DIAGNOSIS — H2513 Age-related nuclear cataract, bilateral: Secondary | ICD-10-CM | POA: Diagnosis not present

## 2019-10-06 DIAGNOSIS — H04123 Dry eye syndrome of bilateral lacrimal glands: Secondary | ICD-10-CM | POA: Diagnosis not present

## 2019-10-06 DIAGNOSIS — H35363 Drusen (degenerative) of macula, bilateral: Secondary | ICD-10-CM | POA: Diagnosis not present

## 2019-10-14 DIAGNOSIS — R69 Illness, unspecified: Secondary | ICD-10-CM | POA: Diagnosis not present

## 2019-10-16 DIAGNOSIS — R519 Headache, unspecified: Secondary | ICD-10-CM | POA: Diagnosis not present

## 2019-10-26 DIAGNOSIS — D509 Iron deficiency anemia, unspecified: Secondary | ICD-10-CM | POA: Diagnosis not present

## 2019-10-26 DIAGNOSIS — Z03818 Encounter for observation for suspected exposure to other biological agents ruled out: Secondary | ICD-10-CM | POA: Diagnosis not present

## 2019-11-11 DIAGNOSIS — G971 Other reaction to spinal and lumbar puncture: Secondary | ICD-10-CM | POA: Diagnosis not present

## 2019-11-11 DIAGNOSIS — Z049 Encounter for examination and observation for unspecified reason: Secondary | ICD-10-CM | POA: Diagnosis not present

## 2019-11-11 DIAGNOSIS — G4452 New daily persistent headache (NDPH): Secondary | ICD-10-CM | POA: Diagnosis not present

## 2019-11-12 ENCOUNTER — Other Ambulatory Visit: Payer: Self-pay | Admitting: Specialist

## 2019-11-12 DIAGNOSIS — R519 Headache, unspecified: Secondary | ICD-10-CM

## 2019-11-23 ENCOUNTER — Other Ambulatory Visit: Payer: Self-pay

## 2019-11-23 ENCOUNTER — Ambulatory Visit (INDEPENDENT_AMBULATORY_CARE_PROVIDER_SITE_OTHER): Payer: Self-pay | Admitting: Plastic Surgery

## 2019-11-23 ENCOUNTER — Telehealth: Payer: Self-pay

## 2019-11-23 ENCOUNTER — Encounter: Payer: Self-pay | Admitting: Plastic Surgery

## 2019-11-23 DIAGNOSIS — Z719 Counseling, unspecified: Secondary | ICD-10-CM

## 2019-11-23 NOTE — Progress Notes (Signed)
Pt in office today per Dr. Stephanie Coup: she is 1 week postop for facelift She is healing well & reports no complications: no fever, no drainage, minimal pain,- she does have intermittent "burning" nerve sensation- which I informed her that this is normal when an incision is made on or near the face, She is able to open her mouth, eyes & no asymmetry of her face/mouth or eyes  she has  minimal swelling, no chills She has continued to wear the facelift/surgical compression garment Her incisions are intact. Per Dr. Stephanie Coup- I removed the black nylon sutures pre-auricular & area remains intact & no bleeding or drainage noted Pt did ask if she could use moisturizer for the rest of her face & -per Dr. Marla Roe: she can use mild moisturizer- without any chemicals/retinols or acids she will not use moisturizer on her incisions- I instructed her to use thin layer of vaseline on incisions  She is reminded that Dr. Lowell Bouton office will call her to schedule a f/u with him for next Mon. 11/30/19, otherwise she will call us or his office for any complications/concerns prior to that  Pt understands & agrees with plan of care

## 2019-11-23 NOTE — Telephone Encounter (Signed)
Pt in office today per Dr. Stephanie Coup: she is 1 week postop for facelift She is healing well & reports no complications: no fever, no drainage, minimal pain,- she does have intermittent "burning" nerve sensation- which I informed her that this is normal when an incision is made on or near the face, She is able to open her mouth, eyes & no asymmetry of her face/mouth or eyes  she has  minimal swelling, no chills She has continued to wear the facelift/surgical compression garment Her incisions are intact. Per Dr. Stephanie Coup- I removed the black nylon sutures pre-auricular & area remains intact & no bleeding or drainage noted Pt did ask if she could use moisturizer for the rest of her face & -per Dr. Marla Roe: she can use mild moisturizer- without any chemicals/retinols or acids she will not use moisturizer on her incisions- I instructed her to use thin layer of vaseline on incisions  She is reminded that Dr. Lowell Bouton office will call her to schedule a f/u with him for next Mon. 11/30/19, otherwise she will call us or his office for any complications/concerns prior to that  Pt understands & agrees with plan of care

## 2019-11-26 DIAGNOSIS — Z719 Counseling, unspecified: Secondary | ICD-10-CM | POA: Insufficient documentation

## 2019-12-14 ENCOUNTER — Ambulatory Visit
Admission: RE | Admit: 2019-12-14 | Discharge: 2019-12-14 | Disposition: A | Discharge status: Home / Self Care | Payer: Medicare HMO | Source: Ambulatory Visit | Attending: Specialist | Admitting: Specialist

## 2019-12-14 ENCOUNTER — Other Ambulatory Visit: Payer: Self-pay

## 2019-12-14 ENCOUNTER — Other Ambulatory Visit: Payer: Self-pay | Admitting: Specialist

## 2019-12-14 DIAGNOSIS — R519 Headache, unspecified: Secondary | ICD-10-CM | POA: Diagnosis not present

## 2019-12-17 DIAGNOSIS — K648 Other hemorrhoids: Secondary | ICD-10-CM | POA: Diagnosis not present

## 2019-12-17 DIAGNOSIS — B349 Viral infection, unspecified: Secondary | ICD-10-CM | POA: Diagnosis not present

## 2019-12-18 DIAGNOSIS — B349 Viral infection, unspecified: Secondary | ICD-10-CM | POA: Diagnosis not present

## 2020-01-18 DIAGNOSIS — K6289 Other specified diseases of anus and rectum: Secondary | ICD-10-CM | POA: Diagnosis not present

## 2020-05-23 DIAGNOSIS — E785 Hyperlipidemia, unspecified: Secondary | ICD-10-CM | POA: Diagnosis not present

## 2020-05-23 DIAGNOSIS — R69 Illness, unspecified: Secondary | ICD-10-CM | POA: Diagnosis not present

## 2020-05-23 DIAGNOSIS — I10 Essential (primary) hypertension: Secondary | ICD-10-CM | POA: Diagnosis not present

## 2020-05-23 DIAGNOSIS — Z79899 Other long term (current) drug therapy: Secondary | ICD-10-CM | POA: Diagnosis not present

## 2020-05-23 DIAGNOSIS — R0789 Other chest pain: Secondary | ICD-10-CM | POA: Diagnosis not present

## 2020-05-23 DIAGNOSIS — R0600 Dyspnea, unspecified: Secondary | ICD-10-CM | POA: Diagnosis not present

## 2020-05-23 DIAGNOSIS — E119 Type 2 diabetes mellitus without complications: Secondary | ICD-10-CM | POA: Diagnosis not present

## 2020-05-23 DIAGNOSIS — D751 Secondary polycythemia: Secondary | ICD-10-CM | POA: Diagnosis not present

## 2020-05-23 DIAGNOSIS — R0602 Shortness of breath: Secondary | ICD-10-CM | POA: Diagnosis not present

## 2020-07-07 DIAGNOSIS — H9201 Otalgia, right ear: Secondary | ICD-10-CM | POA: Diagnosis not present

## 2020-07-07 DIAGNOSIS — T161XXA Foreign body in right ear, initial encounter: Secondary | ICD-10-CM | POA: Diagnosis not present

## 2020-08-04 ENCOUNTER — Other Ambulatory Visit (HOSPITAL_COMMUNITY)
Admission: RE | Admit: 2020-08-04 | Discharge: 2020-08-04 | Disposition: A | Discharge status: Home / Self Care | Payer: Medicare HMO | Source: Ambulatory Visit | Attending: Family Medicine | Admitting: Family Medicine

## 2020-08-04 ENCOUNTER — Other Ambulatory Visit: Payer: Self-pay | Admitting: Family Medicine

## 2020-08-04 DIAGNOSIS — Z1151 Encounter for screening for human papillomavirus (HPV): Secondary | ICD-10-CM | POA: Insufficient documentation

## 2020-08-04 DIAGNOSIS — Z0000 Encounter for general adult medical examination without abnormal findings: Secondary | ICD-10-CM | POA: Diagnosis not present

## 2020-08-04 DIAGNOSIS — I10 Essential (primary) hypertension: Secondary | ICD-10-CM | POA: Diagnosis not present

## 2020-08-04 DIAGNOSIS — E559 Vitamin D deficiency, unspecified: Secondary | ICD-10-CM | POA: Diagnosis not present

## 2020-08-04 DIAGNOSIS — Z79899 Other long term (current) drug therapy: Secondary | ICD-10-CM | POA: Diagnosis not present

## 2020-08-04 DIAGNOSIS — G629 Polyneuropathy, unspecified: Secondary | ICD-10-CM | POA: Diagnosis not present

## 2020-08-04 DIAGNOSIS — G4700 Insomnia, unspecified: Secondary | ICD-10-CM | POA: Diagnosis not present

## 2020-08-04 DIAGNOSIS — M5126 Other intervertebral disc displacement, lumbar region: Secondary | ICD-10-CM | POA: Diagnosis not present

## 2020-08-04 DIAGNOSIS — E1342 Other specified diabetes mellitus with diabetic polyneuropathy: Secondary | ICD-10-CM | POA: Diagnosis not present

## 2020-08-04 DIAGNOSIS — B351 Tinea unguium: Secondary | ICD-10-CM | POA: Diagnosis not present

## 2020-08-04 DIAGNOSIS — R69 Illness, unspecified: Secondary | ICD-10-CM | POA: Diagnosis not present

## 2020-08-04 DIAGNOSIS — Z01411 Encounter for gynecological examination (general) (routine) with abnormal findings: Secondary | ICD-10-CM | POA: Diagnosis present

## 2020-08-04 DIAGNOSIS — E7800 Pure hypercholesterolemia, unspecified: Secondary | ICD-10-CM | POA: Diagnosis not present

## 2020-08-08 LAB — CYTOLOGY - PAP
Comment: NEGATIVE
Diagnosis: NEGATIVE
High risk HPV: NEGATIVE

## 2020-08-12 DIAGNOSIS — L239 Allergic contact dermatitis, unspecified cause: Secondary | ICD-10-CM | POA: Diagnosis not present

## 2020-09-04 DIAGNOSIS — Z20822 Contact with and (suspected) exposure to covid-19: Secondary | ICD-10-CM | POA: Diagnosis not present

## 2020-09-04 DIAGNOSIS — Z03818 Encounter for observation for suspected exposure to other biological agents ruled out: Secondary | ICD-10-CM | POA: Diagnosis not present

## 2020-09-06 DIAGNOSIS — Z20828 Contact with and (suspected) exposure to other viral communicable diseases: Secondary | ICD-10-CM | POA: Diagnosis not present

## 2021-01-05 DIAGNOSIS — R232 Flushing: Secondary | ICD-10-CM | POA: Diagnosis not present

## 2021-01-05 DIAGNOSIS — Z794 Long term (current) use of insulin: Secondary | ICD-10-CM | POA: Diagnosis not present

## 2021-01-05 DIAGNOSIS — K649 Unspecified hemorrhoids: Secondary | ICD-10-CM | POA: Diagnosis not present

## 2021-01-05 DIAGNOSIS — E119 Type 2 diabetes mellitus without complications: Secondary | ICD-10-CM | POA: Diagnosis not present

## 2021-01-05 DIAGNOSIS — L209 Atopic dermatitis, unspecified: Secondary | ICD-10-CM | POA: Diagnosis not present

## 2021-01-05 DIAGNOSIS — R42 Dizziness and giddiness: Secondary | ICD-10-CM | POA: Diagnosis not present

## 2021-01-05 DIAGNOSIS — Z79899 Other long term (current) drug therapy: Secondary | ICD-10-CM | POA: Diagnosis not present

## 2021-02-02 ENCOUNTER — Emergency Department (HOSPITAL_COMMUNITY): Payer: Medicare Other

## 2021-02-02 ENCOUNTER — Encounter (HOSPITAL_COMMUNITY): Payer: Self-pay

## 2021-02-02 ENCOUNTER — Other Ambulatory Visit: Payer: Self-pay

## 2021-02-02 ENCOUNTER — Emergency Department (HOSPITAL_COMMUNITY)
Admission: EM | Admit: 2021-02-02 | Discharge: 2021-02-02 | Disposition: A | Discharge status: Home / Self Care | Payer: Medicare Other | Source: Home / Self Care | Attending: Emergency Medicine | Admitting: Emergency Medicine

## 2021-02-02 DIAGNOSIS — R112 Nausea with vomiting, unspecified: Secondary | ICD-10-CM | POA: Insufficient documentation

## 2021-02-02 DIAGNOSIS — I10 Essential (primary) hypertension: Secondary | ICD-10-CM | POA: Insufficient documentation

## 2021-02-02 DIAGNOSIS — Z20822 Contact with and (suspected) exposure to covid-19: Secondary | ICD-10-CM | POA: Insufficient documentation

## 2021-02-02 DIAGNOSIS — E119 Type 2 diabetes mellitus without complications: Secondary | ICD-10-CM | POA: Insufficient documentation

## 2021-02-02 DIAGNOSIS — Z888 Allergy status to other drugs, medicaments and biological substances status: Secondary | ICD-10-CM | POA: Diagnosis not present

## 2021-02-02 DIAGNOSIS — R6510 Systemic inflammatory response syndrome (SIRS) of non-infectious origin without acute organ dysfunction: Secondary | ICD-10-CM | POA: Diagnosis not present

## 2021-02-02 DIAGNOSIS — Z7984 Long term (current) use of oral hypoglycemic drugs: Secondary | ICD-10-CM | POA: Diagnosis not present

## 2021-02-02 DIAGNOSIS — F32A Depression, unspecified: Secondary | ICD-10-CM | POA: Diagnosis not present

## 2021-02-02 DIAGNOSIS — I959 Hypotension, unspecified: Secondary | ICD-10-CM | POA: Diagnosis not present

## 2021-02-02 DIAGNOSIS — E860 Dehydration: Secondary | ICD-10-CM | POA: Diagnosis not present

## 2021-02-02 DIAGNOSIS — Z8249 Family history of ischemic heart disease and other diseases of the circulatory system: Secondary | ICD-10-CM | POA: Diagnosis not present

## 2021-02-02 DIAGNOSIS — R7989 Other specified abnormal findings of blood chemistry: Secondary | ICD-10-CM | POA: Diagnosis not present

## 2021-02-02 DIAGNOSIS — K529 Noninfective gastroenteritis and colitis, unspecified: Secondary | ICD-10-CM | POA: Diagnosis not present

## 2021-02-02 DIAGNOSIS — R1013 Epigastric pain: Secondary | ICD-10-CM | POA: Insufficient documentation

## 2021-02-02 DIAGNOSIS — I9589 Other hypotension: Secondary | ICD-10-CM | POA: Diagnosis not present

## 2021-02-02 DIAGNOSIS — R109 Unspecified abdominal pain: Secondary | ICD-10-CM

## 2021-02-02 DIAGNOSIS — D696 Thrombocytopenia, unspecified: Secondary | ICD-10-CM | POA: Diagnosis not present

## 2021-02-02 DIAGNOSIS — Z79899 Other long term (current) drug therapy: Secondary | ICD-10-CM | POA: Diagnosis not present

## 2021-02-02 DIAGNOSIS — R509 Fever, unspecified: Secondary | ICD-10-CM | POA: Diagnosis not present

## 2021-02-02 DIAGNOSIS — Z885 Allergy status to narcotic agent status: Secondary | ICD-10-CM | POA: Diagnosis not present

## 2021-02-02 DIAGNOSIS — D1771 Benign lipomatous neoplasm of kidney: Secondary | ICD-10-CM | POA: Diagnosis not present

## 2021-02-02 DIAGNOSIS — E871 Hypo-osmolality and hyponatremia: Secondary | ICD-10-CM | POA: Diagnosis not present

## 2021-02-02 DIAGNOSIS — M545 Low back pain, unspecified: Secondary | ICD-10-CM | POA: Diagnosis not present

## 2021-02-02 DIAGNOSIS — A084 Viral intestinal infection, unspecified: Secondary | ICD-10-CM | POA: Diagnosis not present

## 2021-02-02 DIAGNOSIS — N200 Calculus of kidney: Secondary | ICD-10-CM | POA: Diagnosis not present

## 2021-02-02 DIAGNOSIS — E872 Acidosis: Secondary | ICD-10-CM | POA: Diagnosis not present

## 2021-02-02 DIAGNOSIS — E785 Hyperlipidemia, unspecified: Secondary | ICD-10-CM | POA: Diagnosis not present

## 2021-02-02 DIAGNOSIS — R0602 Shortness of breath: Secondary | ICD-10-CM | POA: Diagnosis not present

## 2021-02-02 DIAGNOSIS — R000 Tachycardia, unspecified: Secondary | ICD-10-CM | POA: Diagnosis not present

## 2021-02-02 DIAGNOSIS — F17210 Nicotine dependence, cigarettes, uncomplicated: Secondary | ICD-10-CM | POA: Insufficient documentation

## 2021-02-02 DIAGNOSIS — E876 Hypokalemia: Secondary | ICD-10-CM | POA: Diagnosis not present

## 2021-02-02 DIAGNOSIS — E861 Hypovolemia: Secondary | ICD-10-CM | POA: Diagnosis not present

## 2021-02-02 DIAGNOSIS — R1084 Generalized abdominal pain: Secondary | ICD-10-CM | POA: Diagnosis not present

## 2021-02-02 LAB — COMPREHENSIVE METABOLIC PANEL
ALT: 32 U/L (ref 0–44)
AST: 38 U/L (ref 15–41)
Albumin: 4.7 g/dL (ref 3.5–5.0)
Alkaline Phosphatase: 61 U/L (ref 38–126)
Anion gap: 10 (ref 5–15)
BUN: 21 mg/dL (ref 8–23)
CO2: 24 mmol/L (ref 22–32)
Calcium: 9.4 mg/dL (ref 8.9–10.3)
Chloride: 105 mmol/L (ref 98–111)
Creatinine, Ser: 0.93 mg/dL (ref 0.44–1.00)
GFR, Estimated: >60 mL/min (ref >60)
Glucose, Bld: 132 mg/dL — ABNORMAL HIGH (ref 70–99)
Potassium: 4.6 mmol/L (ref 3.5–5.1)
Sodium: 139 mmol/L (ref 135–145)
Total Bilirubin: 1 mg/dL (ref 0.3–1.2)
Total Protein: 8.1 g/dL (ref 6.5–8.1)

## 2021-02-02 LAB — URINALYSIS, ROUTINE W REFLEX MICROSCOPIC
Bilirubin Urine: NEGATIVE
Glucose, UA: NEGATIVE mg/dL
Hgb urine dipstick: NEGATIVE
Ketones, ur: NEGATIVE mg/dL
Leukocytes,Ua: NEGATIVE
Nitrite: NEGATIVE
Protein, ur: NEGATIVE mg/dL
Specific Gravity, Urine: 1.024 (ref 1.005–1.030)
pH: 7 (ref 5.0–8.0)

## 2021-02-02 LAB — CBC WITH DIFFERENTIAL/PLATELET
Abs Immature Granulocytes: 0.08 10*3/uL — ABNORMAL HIGH (ref 0.00–0.07)
Basophils Absolute: 0 10*3/uL (ref 0.0–0.1)
Basophils Relative: 0 %
Eosinophils Absolute: 0.1 10*3/uL (ref 0.0–0.5)
Eosinophils Relative: 0 %
HCT: 48.2 % — ABNORMAL HIGH (ref 36.0–46.0)
Hemoglobin: 16.1 g/dL — ABNORMAL HIGH (ref 12.0–15.0)
Immature Granulocytes: 1 %
Lymphocytes Relative: 5 %
Lymphs Abs: 0.8 10*3/uL (ref 0.7–4.0)
MCH: 30.9 pg (ref 26.0–34.0)
MCHC: 33.4 g/dL (ref 30.0–36.0)
MCV: 92.5 fL (ref 80.0–100.0)
Monocytes Absolute: 0.9 10*3/uL (ref 0.1–1.0)
Monocytes Relative: 6 %
Neutro Abs: 14.3 10*3/uL — ABNORMAL HIGH (ref 1.7–7.7)
Neutrophils Relative %: 88 %
Platelets: 270 10*3/uL (ref 150–400)
RBC: 5.21 MIL/uL — ABNORMAL HIGH (ref 3.87–5.11)
RDW: 13.2 % (ref 11.5–15.5)
WBC: 16.2 10*3/uL — ABNORMAL HIGH (ref 4.0–10.5)
nRBC: 0 % (ref 0.0–0.2)

## 2021-02-02 LAB — RESP PANEL BY RT-PCR (FLU A&B, COVID) ARPGX2
Influenza A by PCR: NEGATIVE
Influenza B by PCR: NEGATIVE
SARS Coronavirus 2 by RT PCR: NEGATIVE

## 2021-02-02 LAB — CBG MONITORING, ED: Glucose-Capillary: 137 mg/dL — ABNORMAL HIGH (ref 70–99)

## 2021-02-02 LAB — LIPASE, BLOOD: Lipase: 36 U/L (ref 11–51)

## 2021-02-02 MED ORDER — DICYCLOMINE HCL 20 MG PO TABS: 20.0000 mg | ORAL_TABLET | Freq: Two times a day (BID) | ORAL | 0 refills | Status: DC | PRN

## 2021-02-02 MED ORDER — ONDANSETRON 4 MG PO TBDP
4.0000 mg | ORAL_TABLET | Freq: Once | ORAL | Status: AC
  Administered 2021-02-02: 4 mg via ORAL
  Filled 2021-02-02: qty 1

## 2021-02-02 MED ORDER — FENTANYL CITRATE (PF) 100 MCG/2ML IJ SOLN
50.0000 ug | Freq: Once | INTRAMUSCULAR | Status: DC
  Filled 2021-02-02: qty 2

## 2021-02-02 MED ORDER — ONDANSETRON HCL 4 MG/2ML IJ SOLN
4.0000 mg | Freq: Once | INTRAMUSCULAR | Status: DC
  Filled 2021-02-02: qty 2

## 2021-02-02 MED ORDER — ONDANSETRON 4 MG PO TBDP: 4.0000 mg | ORAL_TABLET | Freq: Three times a day (TID) | ORAL | 0 refills | Status: DC | PRN

## 2021-02-02 MED ORDER — DICYCLOMINE HCL 20 MG PO TABS: 20.0000 mg | ORAL_TABLET | Freq: Two times a day (BID) | ORAL | 0 refills | Status: AC | PRN

## 2021-02-02 MED ORDER — ONDANSETRON 4 MG PO TBDP: 4.0000 mg | ORAL_TABLET | Freq: Three times a day (TID) | ORAL | 0 refills | Status: AC | PRN

## 2021-02-02 NOTE — ED Notes (Signed)
IV attempt x2, patient refused attempt by another nurse and said she wanted PO meds or to go home. PA sophia notified.

## 2021-02-02 NOTE — Discharge Instructions (Addendum)
Continue taking home medications as prescribed. Use Tylenol or ibuprofen as needed for pain. Zofran as needed for nausea or vomiting. Use Bentyl as needed for severe abdominal pain. Follow-up with your primary care doctor in 3 days if your symptoms are improving. Return to the emergency room if you develop high fevers, persistent vomiting despite medication, blood in your vomit, blood in your stool, severe worsening pain, any new, worsening, or concerning symptoms.

## 2021-02-02 NOTE — ED Provider Notes (Signed)
[facility] DEPT Provider Note   CSN: [id number] Arrival date & time: 02/02/21  6712     History Chief Complaint  Patient presents with  . Emesis  . Diarrhea    Debra Morrison is a 65 y.o. female presented for evaluation nausea, vomiting, abdominal pain, diarrhea.  Patient states she woke up around 6:00 this morning with nausea and minimal pain.  She has vomited about 10 times since then.  She reports mid abdominal pain which radiates to her epigastric area.  She has had 2 episodes of diarrhea.  No hematemesis, melena, hematochezia.  She is not taken anything for this.  Pain does not radiate towards her back.  She does have a history of diabetes, CBG was 144 last night before bedtime, she has not checked it yet this morning.  She denies fevers, chills, chest pain, shortness breath, cough, urinary symptoms.  She denies sick contacts.  No new foods or medications.    HPI     Past Medical History:  Diagnosis Date  . Atypical chest pain 05/13/2017  . Diabetes mellitus without complication ([place])   . High cholesterol   . Hypertension     Patient Active Problem List   Diagnosis Date Noted  . Encounter for counseling 11/26/2019  . Atypical chest pain 05/13/2017  . HYPERLIPIDEMIA 01/19/2010  . OBESITY 01/19/2010  . TOBACCO USE 01/19/2010  . OSTEOPENIA 01/19/2010    History reviewed. No pertinent surgical history.   OB History   No obstetric history on file.     Family History  Problem Relation Age of Onset  . Hypertension Mother   . Heart attack Maternal Grandmother     Social History   Tobacco Use  . Smoking status: Current Every Day Smoker    Packs/day: 0.50    Types: Cigarettes  . Smokeless tobacco: Never Used  Substance Use Topics  . Alcohol use: No    Comment: former  . Drug use: No    Home Medications Prior to Admission medications   Medication Sig Start Date End Date Taking? Authorizing Provider  atorvastatin (LIPITOR) 40  MG tablet Take 40 mg by mouth every other day. 03/26/14  Yes [provider]  buPROPion (WELLBUTRIN XL) 150 MG 24 hr tablet Take 150 mg by mouth every morning. 01/07/21  Yes [provider]  calcium carbonate (OS-CAL) 600 MG tablet Take 600 mg by mouth daily.   Yes [provider]  cholecalciferol (VITAMIN D) 1000 units tablet Take 1,000 Units by mouth daily.   Yes [provider]  escitalopram (LEXAPRO) 10 MG tablet Take 10 mg by mouth daily. 01/07/21  Yes [provider]  famotidine (PEPCID) 20 MG tablet Take 1 tablet (20 mg total) by mouth 2 (two) times daily. Patient taking differently: Take 20 mg by mouth daily as needed for heartburn or indigestion. 03/29/17  Yes Palumbo, April, MD  Multiple Vitamin (MULTIVITAMIN WITH MINERALS) TABS tablet Take 1 tablet by mouth daily.   Yes [provider]  Omega-3 1000 MG CAPS Take 1,000 mg by mouth daily.   Yes [provider]  QUEtiapine (SEROQUEL) 25 MG tablet Take 25 mg by mouth at bedtime. 01/07/21  Yes [provider]  TRULICITY 4.58 KD/9.8PJ SOPN Inject 0.75 mg into the skin once a week. 01/01/21  Yes [provider]  dicyclomine (BENTYL) 20 MG tablet Take 1 tablet (20 mg total) by mouth 2 (two) times daily as needed for spasms. 02/02/21   Willys Salvino, PA-C  ondansetron (ZOFRAN ODT) 4 MG disintegrating tablet Take 1 tablet (4 mg total) by mouth every 8 (eight) hours as needed for nausea or vomiting. 02/02/21   Rain Wilhide, PA-C    Allergies    Boniva [ibandronic acid], Hydrocodone-acetaminophen, Hydrocodone-acetaminophen, Lexapro [escitalopram oxalate], and Metformin  Review of Systems   Review of Systems  Gastrointestinal: Positive for abdominal pain, diarrhea, nausea and vomiting.  All other systems reviewed and are negative.   Physical Exam Updated Vital Signs BP (!) 144/81   Pulse 74   Temp 98.1 F (36.7 C)   Resp 19   Ht 5\' 3"  (1.6 m)   Wt 73.5 kg    SpO2 98%   BMI 28.70 kg/m   Physical Exam Vitals and nursing note reviewed.  Constitutional:      General: She is not in acute distress.    Appearance: She is well-developed.     Comments: Appears nontoxic  HENT:     Head: Normocephalic and atraumatic.  Eyes:     Extraocular Movements: Extraocular movements intact.     Conjunctiva/sclera: Conjunctivae normal.     Pupils: Pupils are equal, round, and reactive to light.  Cardiovascular:     Rate and Rhythm: Normal rate and regular rhythm.     Pulses: Normal pulses.  Pulmonary:     Effort: Pulmonary effort is normal. No respiratory distress.     Breath sounds: Normal breath sounds. No wheezing.  Abdominal:     General: There is no distension.     Palpations: Abdomen is soft. There is no mass.     Tenderness: There is abdominal tenderness. There is no guarding or rebound.     Comments: Diffuse ttp of the abd, worse in the epigastric abd. No rigidity, guarding, distention. Negative rebound. No peritonitis.   Musculoskeletal:        General: Normal range of motion.     Cervical back: Normal range of motion and neck supple.  Skin:    General: Skin is warm and dry.     Capillary Refill: Capillary refill takes less than 2 seconds.  Neurological:     Mental Status: She is alert and oriented to person, place, and time.     ED Results / Procedures / Treatments   Labs (all labs ordered are listed, but only abnormal results are displayed) Labs Reviewed  CBC WITH DIFFERENTIAL/PLATELET - Abnormal; Notable for the following components:      Result Value   WBC 16.2 (*)    RBC 5.21 (*)    Hemoglobin 16.1 (*)    HCT 48.2 (*)    Neutro Abs 14.3 (*)    Abs Immature Granulocytes 0.08 (*)    All other components within normal limits  COMPREHENSIVE METABOLIC PANEL - Abnormal; Notable for the following components:   Glucose, Bld 132 (*)    All other components within normal limits  CBG MONITORING, ED - Abnormal; Notable for the  following components:   Glucose-Capillary 137 (*)    All other components within normal limits  RESP PANEL BY RT-PCR (FLU A&B, COVID) ARPGX2  LIPASE, BLOOD  URINALYSIS, ROUTINE W REFLEX MICROSCOPIC    EKG None  Radiology CT ABDOMEN PELVIS WO CONTRAST  Result Date: 02/02/2021 CLINICAL DATA:  Acute onset of abdominal pain and nausea and vomiting this morning. EXAM: CT ABDOMEN AND PELVIS WITHOUT CONTRAST TECHNIQUE: Multidetector CT imaging of the abdomen and pelvis was performed following the standard protocol without IV contrast. COMPARISON:  None. FINDINGS: Lower chest: No  acute findings. Hepatobiliary: No mass visualized on this unenhanced exam. Gallbladder is unremarkable. No evidence of biliary ductal dilatation. Pancreas: No mass or inflammatory process visualized on this unenhanced exam. Spleen:  Within normal limits in size. Adrenals/Urinary tract: A 2 mm calculus is noted in the lower pole of the left kidney. No evidence of ureteral calculi or hydronephrosis. A sub-cm fat attenuation lesion is also seen in the posterior interpolar region of the left kidney, consistent with a tiny benign angiomyolipoma. Stomach/Bowel: No evidence of obstruction, inflammatory process, or abnormal fluid collections. Normal appendix visualized. Vascular/Lymphatic: No pathologically enlarged lymph nodes identified. No evidence of abdominal aortic aneurysm. Reproductive:  No mass or other significant abnormality. Other:  None. Musculoskeletal:  No suspicious bone lesions identified. IMPRESSION: Tiny left renal calculus. No evidence of ureteral calculi, hydronephrosis, or other acute findings. Tiny benign left renal angiomyolipoma. Electronically Signed   By: Marlaine Hind M.D.   On: 02/02/2021 11:40    Procedures Procedures   Medications Ordered in ED Medications  ondansetron (ZOFRAN) injection 4 mg (4 mg Intravenous Not Given 02/02/21 0839)  fentaNYL (SUBLIMAZE) injection 50 mcg (50 mcg Intravenous Not Given  02/02/21 0839)  ondansetron (ZOFRAN-ODT) disintegrating tablet 4 mg (4 mg Oral Given 02/02/21 0908)    ED Course  I have reviewed the triage vital signs and the nursing notes.  Pertinent labs & imaging results that were available during my care of the patient were reviewed by me and considered in my medical decision making (see chart for details).    MDM Rules/Calculators/A&P                          Patient presenting for evaluation nausea, vomiting, Donnell pain, diarrhea.  On exam, patient appears nontoxic.  She does have diffuse tenderness palpation the abdomen, worse in epigastric area.  Consider viral GI illness as patient has vomiting and diarrhea.  Also consider diverticulitis, although less classic with location of patient's pain.  Consider pancreatitis versus gallbladder issue.  Will obtain labs, treat symptomatically, obtain CT scan.  Labs are obtained, but IV unable to be established and patient refusing further attempts.  As such, ODT Zofran given.  On reassessment, patient reports improvement of symptoms with Zofran.  Pain is improved, though she still has tenderness with palpation on my exam.  She now has tenderness more in the lower abdomen.  Labs interpreted by me, shows leukocytosis of 16.  Otherwise reassuring.  Discussed that in the setting of continued pain leukocytosis, head CT could be beneficial for evaluation of possible diverticulitis or other infection.  I encouraged patient to get an IV so that it can be done with contrast, patient continued to refuse.  As such, obtain CT abdomen pelvis without.  CT abdomen pelvis negative for acute findings.  On reassessment, patient continues to have no further nausea, vomiting, abdominal pain.  Discussed continued symptomatic management at home.  Likely GI illness.  At this time, patient appears safe for discharge.  Return precautions given.  Patient states she understands and agrees to plan.  Final Clinical Impression(s) / ED  Diagnoses Final diagnoses:  Non-intractable vomiting with nausea, unspecified vomiting type  Acute abdominal pain    Rx / DC Orders ED Discharge Orders         Ordered    ondansetron (ZOFRAN ODT) 4 MG disintegrating tablet  Every 8 hours PRN,   Status:  Discontinued        02/02/21 1302  dicyclomine (BENTYL) 20 MG tablet  2 times daily PRN,   Status:  Discontinued        02/02/21 1302    dicyclomine (BENTYL) 20 MG tablet  2 times daily PRN        02/02/21 1342    ondansetron (ZOFRAN ODT) 4 MG disintegrating tablet  Every 8 hours PRN        02/02/21 1342           Orlando Thalmann, PA-C 02/02/21 1528    Daleen Bo, MD 02/02/21 1605

## 2021-02-02 NOTE — ED Triage Notes (Signed)
Generalized abd pain with Emesis x10, diarrhea x2 since 0600.

## 2021-02-02 NOTE — ED Notes (Signed)
Pt up and ambulatory to the bathroom at this time.

## 2021-02-04 ENCOUNTER — Inpatient Hospital Stay (HOSPITAL_COMMUNITY)
Admission: EM | Admit: 2021-02-04 | Discharge: 2021-02-06 | DRG: 392 | Disposition: A | Discharge status: Home / Self Care | Payer: Medicare Other | Attending: Internal Medicine | Admitting: Internal Medicine

## 2021-02-04 ENCOUNTER — Emergency Department (HOSPITAL_COMMUNITY): Payer: Medicare Other

## 2021-02-04 ENCOUNTER — Other Ambulatory Visit: Payer: Self-pay

## 2021-02-04 DIAGNOSIS — I959 Hypotension, unspecified: Secondary | ICD-10-CM

## 2021-02-04 DIAGNOSIS — E871 Hypo-osmolality and hyponatremia: Secondary | ICD-10-CM | POA: Diagnosis present

## 2021-02-04 DIAGNOSIS — I10 Essential (primary) hypertension: Secondary | ICD-10-CM | POA: Diagnosis present

## 2021-02-04 DIAGNOSIS — K529 Noninfective gastroenteritis and colitis, unspecified: Secondary | ICD-10-CM | POA: Diagnosis present

## 2021-02-04 DIAGNOSIS — Z79899 Other long term (current) drug therapy: Secondary | ICD-10-CM

## 2021-02-04 DIAGNOSIS — E872 Acidosis: Secondary | ICD-10-CM | POA: Diagnosis present

## 2021-02-04 DIAGNOSIS — Z7984 Long term (current) use of oral hypoglycemic drugs: Secondary | ICD-10-CM

## 2021-02-04 DIAGNOSIS — R7989 Other specified abnormal findings of blood chemistry: Secondary | ICD-10-CM

## 2021-02-04 DIAGNOSIS — E860 Dehydration: Secondary | ICD-10-CM

## 2021-02-04 DIAGNOSIS — E876 Hypokalemia: Secondary | ICD-10-CM | POA: Diagnosis present

## 2021-02-04 DIAGNOSIS — E119 Type 2 diabetes mellitus without complications: Secondary | ICD-10-CM

## 2021-02-04 DIAGNOSIS — F17210 Nicotine dependence, cigarettes, uncomplicated: Secondary | ICD-10-CM | POA: Diagnosis present

## 2021-02-04 DIAGNOSIS — Z888 Allergy status to other drugs, medicaments and biological substances status: Secondary | ICD-10-CM

## 2021-02-04 DIAGNOSIS — R509 Fever, unspecified: Secondary | ICD-10-CM

## 2021-02-04 DIAGNOSIS — I9589 Other hypotension: Secondary | ICD-10-CM | POA: Diagnosis not present

## 2021-02-04 DIAGNOSIS — A084 Viral intestinal infection, unspecified: Principal | ICD-10-CM | POA: Diagnosis present

## 2021-02-04 DIAGNOSIS — E861 Hypovolemia: Secondary | ICD-10-CM | POA: Diagnosis not present

## 2021-02-04 DIAGNOSIS — R000 Tachycardia, unspecified: Secondary | ICD-10-CM | POA: Diagnosis present

## 2021-02-04 DIAGNOSIS — Z8249 Family history of ischemic heart disease and other diseases of the circulatory system: Secondary | ICD-10-CM

## 2021-02-04 DIAGNOSIS — R7401 Elevation of levels of liver transaminase levels: Secondary | ICD-10-CM | POA: Diagnosis present

## 2021-02-04 DIAGNOSIS — R6510 Systemic inflammatory response syndrome (SIRS) of non-infectious origin without acute organ dysfunction: Secondary | ICD-10-CM | POA: Diagnosis present

## 2021-02-04 DIAGNOSIS — R1084 Generalized abdominal pain: Secondary | ICD-10-CM

## 2021-02-04 DIAGNOSIS — F419 Anxiety disorder, unspecified: Secondary | ICD-10-CM | POA: Diagnosis present

## 2021-02-04 DIAGNOSIS — D696 Thrombocytopenia, unspecified: Secondary | ICD-10-CM | POA: Diagnosis present

## 2021-02-04 DIAGNOSIS — E785 Hyperlipidemia, unspecified: Secondary | ICD-10-CM | POA: Diagnosis present

## 2021-02-04 DIAGNOSIS — Z885 Allergy status to narcotic agent status: Secondary | ICD-10-CM

## 2021-02-04 DIAGNOSIS — Z20822 Contact with and (suspected) exposure to covid-19: Secondary | ICD-10-CM | POA: Diagnosis present

## 2021-02-04 DIAGNOSIS — F32A Depression, unspecified: Secondary | ICD-10-CM | POA: Diagnosis present

## 2021-02-04 LAB — RESP PANEL BY RT-PCR (FLU A&B, COVID) ARPGX2
Influenza A by PCR: NEGATIVE
Influenza B by PCR: NEGATIVE
SARS Coronavirus 2 by RT PCR: NEGATIVE

## 2021-02-04 LAB — HEPATITIS PANEL, ACUTE
HCV Ab: NONREACTIVE
Hep A IgM: NONREACTIVE
Hep B C IgM: NONREACTIVE
Hepatitis B Surface Ag: NONREACTIVE

## 2021-02-04 LAB — C-REACTIVE PROTEIN: CRP: 9.2 mg/dL — ABNORMAL HIGH (ref <1.0)

## 2021-02-04 LAB — HIV ANTIBODY (ROUTINE TESTING W REFLEX): HIV Screen 4th Generation wRfx: NONREACTIVE

## 2021-02-04 LAB — COMPREHENSIVE METABOLIC PANEL
ALT: 148 U/L — ABNORMAL HIGH (ref 0–44)
AST: 131 U/L — ABNORMAL HIGH (ref 15–41)
Albumin: 3.8 g/dL (ref 3.5–5.0)
Alkaline Phosphatase: 62 U/L (ref 38–126)
Anion gap: 9 (ref 5–15)
BUN: 14 mg/dL (ref 8–23)
CO2: 19 mmol/L — ABNORMAL LOW (ref 22–32)
Calcium: 8.3 mg/dL — ABNORMAL LOW (ref 8.9–10.3)
Chloride: 103 mmol/L (ref 98–111)
Creatinine, Ser: 0.9 mg/dL (ref 0.44–1.00)
GFR, Estimated: >60 mL/min (ref >60)
Glucose, Bld: 168 mg/dL — ABNORMAL HIGH (ref 70–99)
Potassium: 3.4 mmol/L — ABNORMAL LOW (ref 3.5–5.1)
Sodium: 131 mmol/L — ABNORMAL LOW (ref 135–145)
Total Bilirubin: 0.8 mg/dL (ref 0.3–1.2)
Total Protein: 6.4 g/dL — ABNORMAL LOW (ref 6.5–8.1)

## 2021-02-04 LAB — GLUCOSE, CAPILLARY
Glucose-Capillary: 120 mg/dL — ABNORMAL HIGH (ref 70–99)
Glucose-Capillary: 131 mg/dL — ABNORMAL HIGH (ref 70–99)

## 2021-02-04 LAB — CBC WITH DIFFERENTIAL/PLATELET
Abs Immature Granulocytes: 0.07 10*3/uL (ref 0.00–0.07)
Basophils Absolute: 0 10*3/uL (ref 0.0–0.1)
Basophils Relative: 0 %
Eosinophils Absolute: 0.1 10*3/uL (ref 0.0–0.5)
Eosinophils Relative: 1 %
HCT: 44 % (ref 36.0–46.0)
Hemoglobin: 14.5 g/dL (ref 12.0–15.0)
Immature Granulocytes: 1 %
Lymphocytes Relative: 5 %
Lymphs Abs: 0.5 10*3/uL — ABNORMAL LOW (ref 0.7–4.0)
MCH: 30.6 pg (ref 26.0–34.0)
MCHC: 33 g/dL (ref 30.0–36.0)
MCV: 92.8 fL (ref 80.0–100.0)
Monocytes Absolute: 0.1 10*3/uL (ref 0.1–1.0)
Monocytes Relative: 1 %
Neutro Abs: 8.8 10*3/uL — ABNORMAL HIGH (ref 1.7–7.7)
Neutrophils Relative %: 92 %
Platelets: 148 10*3/uL — ABNORMAL LOW (ref 150–400)
RBC: 4.74 MIL/uL (ref 3.87–5.11)
RDW: 13.5 % (ref 11.5–15.5)
WBC: 9.6 10*3/uL (ref 4.0–10.5)
nRBC: 0 % (ref 0.0–0.2)

## 2021-02-04 LAB — URINALYSIS, ROUTINE W REFLEX MICROSCOPIC
Bacteria, UA: NONE SEEN
Bilirubin Urine: NEGATIVE
Glucose, UA: NEGATIVE mg/dL
Hgb urine dipstick: NEGATIVE
Ketones, ur: NEGATIVE mg/dL
Leukocytes,Ua: NEGATIVE
Nitrite: NEGATIVE
Protein, ur: NEGATIVE mg/dL
Specific Gravity, Urine: 1.016 (ref 1.005–1.030)
pH: 5 (ref 5.0–8.0)

## 2021-02-04 LAB — SEDIMENTATION RATE: Sed Rate: 1 mm/hr (ref 0–22)

## 2021-02-04 LAB — LACTIC ACID, PLASMA: Lactic Acid, Venous: 1.5 mmol/L (ref 0.5–1.9)

## 2021-02-04 LAB — HEMOGLOBIN A1C
Hgb A1c MFr Bld: 5.7 % — ABNORMAL HIGH (ref 4.8–5.6)
Mean Plasma Glucose: 116.89 mg/dL

## 2021-02-04 LAB — CK: Total CK: 665 U/L — ABNORMAL HIGH (ref 38–234)

## 2021-02-04 MED ORDER — HYOSCYAMINE SULFATE 0.5 MG/ML IJ SOLN
0.1250 mg | Freq: Once | INTRAMUSCULAR | Status: AC
  Administered 2021-02-04: 0.125 mg via INTRAVENOUS
  Filled 2021-02-04: qty 0.25

## 2021-02-04 MED ORDER — ONDANSETRON HCL 4 MG PO TABS: 4.0000 mg | ORAL_TABLET | Freq: Four times a day (QID) | ORAL | Status: DC | PRN

## 2021-02-04 MED ORDER — ONDANSETRON HCL 4 MG/2ML IJ SOLN
4.0000 mg | Freq: Four times a day (QID) | INTRAMUSCULAR | Status: DC | PRN
  Administered 2021-02-04 – 2021-02-05 (×2): 4 mg via INTRAVENOUS
  Filled 2021-02-04 (×2): qty 2

## 2021-02-04 MED ORDER — SODIUM CHLORIDE 0.45 % IV SOLN
INTRAVENOUS | Status: DC
  Filled 2021-02-04 (×3): qty 1000

## 2021-02-04 MED ORDER — ACETAMINOPHEN 650 MG RE SUPP: 650.0000 mg | Freq: Once | RECTAL | Status: DC

## 2021-02-04 MED ORDER — LACTATED RINGERS IV BOLUS
1000.0000 mL | Freq: Once | INTRAVENOUS | Status: AC
  Administered 2021-02-04: 1000 mL via INTRAVENOUS

## 2021-02-04 MED ORDER — HYDROMORPHONE HCL 1 MG/ML IJ SOLN
0.5000 mg | INTRAMUSCULAR | Status: DC | PRN
  Administered 2021-02-04: 0.5 mg via INTRAVENOUS
  Filled 2021-02-04: qty 1

## 2021-02-04 MED ORDER — MORPHINE SULFATE (PF) 4 MG/ML IV SOLN
4.0000 mg | Freq: Once | INTRAVENOUS | Status: AC
  Administered 2021-02-04: 4 mg via INTRAVENOUS
  Filled 2021-02-04: qty 1

## 2021-02-04 MED ORDER — ACETAMINOPHEN 325 MG PO TABS
650.0000 mg | ORAL_TABLET | Freq: Once | ORAL | Status: AC
  Administered 2021-02-04: 650 mg via ORAL
  Filled 2021-02-04: qty 2

## 2021-02-04 MED ORDER — ZOLPIDEM TARTRATE 5 MG PO TABS
5.0000 mg | ORAL_TABLET | Freq: Every evening | ORAL | Status: DC | PRN
  Administered 2021-02-05: 5 mg via ORAL
  Filled 2021-02-04: qty 1

## 2021-02-04 MED ORDER — ENOXAPARIN SODIUM 40 MG/0.4ML ~~LOC~~ SOLN
40.0000 mg | SUBCUTANEOUS | Status: DC
  Administered 2021-02-04: 40 mg via SUBCUTANEOUS
  Filled 2021-02-04 (×2): qty 0.4

## 2021-02-04 MED ORDER — QUETIAPINE FUMARATE 25 MG PO TABS
25.0000 mg | ORAL_TABLET | Freq: Every day | ORAL | Status: DC
  Administered 2021-02-04 – 2021-02-05 (×2): 25 mg via ORAL
  Filled 2021-02-04 (×3): qty 1

## 2021-02-04 MED ORDER — ESCITALOPRAM OXALATE 10 MG PO TABS
10.0000 mg | ORAL_TABLET | Freq: Every day | ORAL | Status: DC
  Administered 2021-02-05 – 2021-02-06 (×2): 10 mg via ORAL
  Filled 2021-02-04 (×2): qty 1

## 2021-02-04 MED ORDER — DICYCLOMINE HCL 20 MG PO TABS
20.0000 mg | ORAL_TABLET | Freq: Two times a day (BID) | ORAL | Status: DC | PRN
  Filled 2021-02-04: qty 1

## 2021-02-04 MED ORDER — FAMOTIDINE 20 MG PO TABS
20.0000 mg | ORAL_TABLET | Freq: Two times a day (BID) | ORAL | Status: DC
  Administered 2021-02-04 – 2021-02-06 (×4): 20 mg via ORAL
  Filled 2021-02-04 (×4): qty 1

## 2021-02-04 MED ORDER — INSULIN ASPART 100 UNIT/ML ~~LOC~~ SOLN: 0.0000 [IU] | Freq: Three times a day (TID) | SUBCUTANEOUS | Status: DC

## 2021-02-04 MED ORDER — KETOROLAC TROMETHAMINE 15 MG/ML IJ SOLN
15.0000 mg | Freq: Once | INTRAMUSCULAR | Status: AC
  Administered 2021-02-04: 15 mg via INTRAVENOUS
  Filled 2021-02-04: qty 1

## 2021-02-04 MED ORDER — IOHEXOL 300 MG/ML  SOLN
100.0000 mL | Freq: Once | INTRAMUSCULAR | Status: AC | PRN
  Administered 2021-02-04: 100 mL via INTRAVENOUS

## 2021-02-04 MED ORDER — DIPHENOXYLATE-ATROPINE 2.5-0.025 MG PO TABS
2.0000 | ORAL_TABLET | Freq: Once | ORAL | Status: AC
  Administered 2021-02-04: 2 via ORAL
  Filled 2021-02-04: qty 2

## 2021-02-04 MED ORDER — BUPROPION HCL ER (XL) 150 MG PO TB24
150.0000 mg | ORAL_TABLET | Freq: Every morning | ORAL | Status: DC
Start: 2021-02-05 — End: 2021-02-06
  Administered 2021-02-05 – 2021-02-06 (×2): 150 mg via ORAL
  Filled 2021-02-04 (×2): qty 1

## 2021-02-04 MED ORDER — CALCIUM CARBONATE 1250 (500 CA) MG PO TABS
1.0000 | ORAL_TABLET | Freq: Every day | ORAL | Status: DC
  Administered 2021-02-05: 500 mg via ORAL
  Filled 2021-02-04: qty 1

## 2021-02-04 MED ORDER — GADOBUTROL 1 MMOL/ML IV SOLN
7.5000 mL | Freq: Once | INTRAVENOUS | Status: AC | PRN
  Administered 2021-02-04: 7.5 mL via INTRAVENOUS

## 2021-02-04 MED ORDER — ATORVASTATIN CALCIUM 40 MG PO TABS: 40.0000 mg | ORAL_TABLET | ORAL | Status: DC

## 2021-02-04 NOTE — ED Notes (Signed)
Patient transported to MRI 

## 2021-02-04 NOTE — ED Provider Notes (Signed)
[facility] EMERGENCY DEPARTMENT Provider Note   CSN: [id number] Arrival date & time: 02/04/21  2585     History Chief Complaint  Patient presents with  . Abdominal Pain  . Fever    Debra Morrison is a 65 y.o. female.  Patient with history of DM, HTN, HLD presents for abdominal and low back pain, nausea, vomiting and diarrhea. No hematemesis or bloody stools. She reports low grade fever. She was seen on 3/24 at the [facility] ED where complete work up, including noncontrast CT and labs, were negative. She states that when seen at that time she had more nausea and vomiting than diarrhea, and now the vomiting is improved (x 2 today only) but the diarrhea is worse, more frequent and "like water", and she is having significant lower back pain. She has been using prescribed medication for nausea, and has taken on OTC medication for diarrhea once earlier yesterday.    The history is provided by the patient. No language interpreter was used.  Abdominal Pain Associated symptoms: diarrhea, fever, nausea and vomiting   Associated symptoms: no chills and no dysuria   Fever Associated symptoms: diarrhea, nausea and vomiting   Associated symptoms: no chills and no dysuria        Past Medical History:  Diagnosis Date  . Atypical chest pain 05/13/2017  . Diabetes mellitus without complication ([place])   . High cholesterol   . Hypertension     Patient Active Problem List   Diagnosis Date Noted  . Encounter for counseling 11/26/2019  . Atypical chest pain 05/13/2017  . HYPERLIPIDEMIA 01/19/2010  . OBESITY 01/19/2010  . TOBACCO USE 01/19/2010  . OSTEOPENIA 01/19/2010    No past surgical history on file.   OB History   No obstetric history on file.     Family History  Problem Relation Age of Onset  . Hypertension Mother   . Heart attack Maternal Grandmother     Social History   Tobacco Use  . Smoking status: Current Every Day Smoker    Packs/day: 0.50     Types: Cigarettes  . Smokeless tobacco: Never Used  Substance Use Topics  . Alcohol use: No    Comment: former  . Drug use: No    Home Medications Prior to Admission medications   Medication Sig Start Date End Date Taking? Authorizing Provider  atorvastatin (LIPITOR) 40 MG tablet Take 40 mg by mouth every other day. 03/26/14   [provider]  buPROPion (WELLBUTRIN XL) 150 MG 24 hr tablet Take 150 mg by mouth every morning. 01/07/21   [provider]  calcium carbonate (OS-CAL) 600 MG tablet Take 600 mg by mouth daily.    [provider]  cholecalciferol (VITAMIN D) 1000 units tablet Take 1,000 Units by mouth daily.    [provider]  dicyclomine (BENTYL) 20 MG tablet Take 1 tablet (20 mg total) by mouth 2 (two) times daily as needed for spasms. 02/02/21   Caccavale, Sophia, PA-C  escitalopram (LEXAPRO) 10 MG tablet Take 10 mg by mouth daily. 01/07/21   [provider]  famotidine (PEPCID) 20 MG tablet Take 1 tablet (20 mg total) by mouth 2 (two) times daily. Patient taking differently: Take 20 mg by mouth daily as needed for heartburn or indigestion. 03/29/17   Palumbo, April, MD  Multiple Vitamin (MULTIVITAMIN WITH MINERALS) TABS tablet Take 1 tablet by mouth daily.    [provider]  Omega-3 1000 MG CAPS Take 1,000 mg by  mouth daily.    [provider]  ondansetron (ZOFRAN ODT) 4 MG disintegrating tablet Take 1 tablet (4 mg total) by mouth every 8 (eight) hours as needed for nausea or vomiting. 02/02/21   Caccavale, Sophia, PA-C  QUEtiapine (SEROQUEL) 25 MG tablet Take 25 mg by mouth at bedtime. 01/07/21   [provider]  TRULICITY 9.38 HW/2.9HB SOPN Inject 0.75 mg into the skin once a week. 01/01/21   [provider]    Allergies    Boniva [ibandronic acid], Hydrocodone-acetaminophen, Hydrocodone-acetaminophen, Lexapro [escitalopram oxalate], and Metformin  Review of Systems   Review of Systems   Constitutional: Positive for fever. Negative for chills.  HENT: Negative.   Respiratory: Negative.   Cardiovascular: Negative.   Gastrointestinal: Positive for abdominal pain, diarrhea, nausea and vomiting. Negative for blood in stool.  Genitourinary: Negative for decreased urine volume and dysuria.  Musculoskeletal: Positive for back pain.  Skin: Negative.   Neurological: Negative.     Physical Exam Updated Vital Signs BP (!) 142/75   Pulse (!) 103   Temp (!) 100.6 F (38.1 C)   Resp (!) 35   SpO2 96%   Physical Exam Vitals and nursing note reviewed.  Constitutional:      Appearance: She is well-developed.  HENT:     Head: Normocephalic.  Cardiovascular:     Rate and Rhythm: Regular rhythm. Tachycardia present.     Heart sounds: No murmur heard.   Pulmonary:     Effort: Pulmonary effort is normal.     Breath sounds: Normal breath sounds. No wheezing, rhonchi or rales.  Abdominal:     General: Bowel sounds are normal. There is no distension.     Palpations: Abdomen is soft.     Tenderness: There is generalized abdominal tenderness (Mild diffuse tenderness.). There is no guarding or rebound.  Musculoskeletal:        General: Normal range of motion.     Cervical back: Normal range of motion and neck supple.  Skin:    General: Skin is warm and dry.  Neurological:     Mental Status: She is alert and oriented to person, place, and time.     ED Results / Procedures / Treatments   Labs (all labs ordered are listed, but only abnormal results are displayed) Labs Reviewed  LACTIC ACID, PLASMA  LACTIC ACID, PLASMA  COMPREHENSIVE METABOLIC PANEL  CBC WITH DIFFERENTIAL/PLATELET  URINALYSIS, ROUTINE W REFLEX MICROSCOPIC    EKG None  Radiology CT ABDOMEN PELVIS WO CONTRAST  Result Date: 02/02/2021 CLINICAL DATA:  Acute onset of abdominal pain and nausea and vomiting this morning. EXAM: CT ABDOMEN AND PELVIS WITHOUT CONTRAST TECHNIQUE: Multidetector CT imaging of  the abdomen and pelvis was performed following the standard protocol without IV contrast. COMPARISON:  None. FINDINGS: Lower chest: No acute findings. Hepatobiliary: No mass visualized on this unenhanced exam. Gallbladder is unremarkable. No evidence of biliary ductal dilatation. Pancreas: No mass or inflammatory process visualized on this unenhanced exam. Spleen:  Within normal limits in size. Adrenals/Urinary tract: A 2 mm calculus is noted in the lower pole of the left kidney. No evidence of ureteral calculi or hydronephrosis. A sub-cm fat attenuation lesion is also seen in the posterior interpolar region of the left kidney, consistent with a tiny benign angiomyolipoma. Stomach/Bowel: No evidence of obstruction, inflammatory process, or abnormal fluid collections. Normal appendix visualized. Vascular/Lymphatic: No pathologically enlarged lymph nodes identified. No evidence of abdominal aortic aneurysm. Reproductive:  No mass or other significant abnormality.  Other:  None. Musculoskeletal:  No suspicious bone lesions identified. IMPRESSION: Tiny left renal calculus. No evidence of ureteral calculi, hydronephrosis, or other acute findings. Tiny benign left renal angiomyolipoma. Electronically Signed   By: Marlaine Hind M.D.   On: 02/02/2021 11:40    Procedures Procedures   Medications Ordered in ED Medications - No data to display  ED Course  I have reviewed the triage vital signs and the nursing notes.  Pertinent labs & imaging results that were available during my care of the patient were reviewed by me and considered in my medical decision making (see chart for details).    MDM Rules/Calculators/A&P                          Patient to ED with abdominal pain, diarrhea, vomiting and low back pain as detailed in the HPI.   She appears uncomfortable, anxious. She has a low grade temp on arrival, tachycardia. Labs pending. Chart reviewed. Seen at [facility] 3/24. CT w/o CM did not show any acute findings.  Since that visit, her vomiting improved and diarrhea worsened, now with onset low back pain about 6 hours ago.   Labs show elevated liver enzymes. Normal T-bili, normal renal function. Leukocytosis of 16 on 3/24 improved and normal tonight (9.6). Lactic acid normal at 1.5.  IV morphine provided for pain which resolved the pain in her back. She is comfortable, no longer anxious appearing.   The patient is examined by Dr. Almyra Free. Given diabetic history, epidural abscess considered. Additional lab studies ordered. Will observe and review labs when completed. Consider MRI lumbar spine vs contrasted CT abd/pel vs decision that imaging is not indicated.   Patient care signed out to [facility], PA-C, at end of shift.  Final Clinical Impression(s) / ED Diagnoses Final diagnoses:  None   1. Diarrhea 2. Low back pain 3. Abdominal pain  Rx / DC Orders ED Discharge Orders    None       Dennie Bible 02/04/21 4098    Luna Fuse, MD 02/04/21 [phone]

## 2021-02-04 NOTE — Plan of Care (Signed)

## 2021-02-04 NOTE — ED Notes (Signed)
Report given to [facility] at this time

## 2021-02-04 NOTE — ED Notes (Signed)
Messaged pharmacy reference need of medication

## 2021-02-04 NOTE — ED Notes (Signed)
Patient transported to CT 

## 2021-02-04 NOTE — ED Notes (Signed)
Pt requesting to go to the restroom at this time

## 2021-02-04 NOTE — ED Triage Notes (Signed)
Pt presents to the ED with nausea, vomiting, diarrhea, back pain, and SOB for a few days with possible fever. Seen recently at another ER, but unsure of what she was diagnosed with. Pt is tachypneic and anxious in triage.

## 2021-02-04 NOTE — ED Notes (Signed)
Patient back from XRAY

## 2021-02-04 NOTE — ED Notes (Signed)
Back from radiology at this time. 

## 2021-02-04 NOTE — ED Notes (Signed)
Provider at bedside at this time

## 2021-02-04 NOTE — H&P (Signed)
History and Physical    Debra Morrison HUT:[id number] DOB: 10-17-1956 DOA: 02/04/2021  PCP: Jonathon Jordan, MD (Confirm with patient/family/NH records and if not entered, this has to be entered at [facility] point of entry) Patient coming from: Home  I have personally briefly reviewed patient's old medical records in [place]  Chief Complaint: N/V diarrhea and fever.  HPI: Debra Morrison is a 65 y.o. female with medical history significant of IIDM, HTN, HLD, anxiety/depression, presented with 3 days of nauseous vomiting diarrhea and fever.  Patient lives with her husband.  3 days ago, patient suddenly developed nausea and vomiting, mostly stomach content, without bowel or blood.  She estimated vomited more than 10 times that day and started to have subjective fever.  And yesterday, patient's nausea and vomiting improved, but then developed abdominal cramping and severe diarrhea.  Abdomen cramping and diarrhea along with tenesmus, diarrhea mostly watery, almost every hour including at night.  Last episode of diarrhea was this morning.  She checked her temperature yesterday was between 102 and 105.  She went to [place] [facility] ED 2 days ago, was given some IV fluids and sent home with Bentyl and Zofran, after a noncontrast CAT scan showed no significant acute etiology.  Patient denied any recent travel, she has been living with her husband at home and husband does not have any GI symptoms or fever.  ED Course: Blood pressure on the lower side even after 2.5 liters of IVF, spiked fever 102.6 in ED.  CT abdomen with contrast showed no significant etiology.  WBC 9.8 compared to 16.42 days ago.  AST and ALT elevated.  Review of Systems: As per HPI otherwise 14 point review of systems negative.    Past Medical History:  Diagnosis Date  . Atypical chest pain 05/13/2017  . Diabetes mellitus without complication ([place])   . High cholesterol   . Hypertension     No past surgical history on file.   reports  that she has been smoking cigarettes. She has been smoking about 0.50 packs per day. She has never used smokeless tobacco. She reports that she does not drink alcohol and does not use drugs.  Allergies  Allergen Reactions  . Boniva [Ibandronic Acid] Other (See Comments)    Back pain  . Hydrocodone-Acetaminophen     Other reaction(s): itch  . Hydrocodone-Acetaminophen Itching and Other (See Comments)    Other reaction(s): itch   . Lexapro [Escitalopram Oxalate]     GI upset  . Metformin     Other reaction(s): headaches, dizziness, nausea    Family History  Problem Relation Age of Onset  . Hypertension Mother   . Heart attack Maternal Grandmother      Prior to Admission medications   Medication Sig Start Date End Date Taking? Authorizing Provider  atorvastatin (LIPITOR) 40 MG tablet Take 40 mg by mouth every other day. 03/26/14   [provider]  buPROPion (WELLBUTRIN XL) 150 MG 24 hr tablet Take 150 mg by mouth every morning. 01/07/21   [provider]  calcium carbonate (OS-CAL) 600 MG tablet Take 600 mg by mouth daily.    [provider]  cholecalciferol (VITAMIN D) 1000 units tablet Take 1,000 Units by mouth daily.    [provider]  dicyclomine (BENTYL) 20 MG tablet Take 1 tablet (20 mg total) by mouth 2 (two) times daily as needed for spasms. 02/02/21   Caccavale, Sophia, PA-C  escitalopram (LEXAPRO) 10 MG tablet Take 10 mg by mouth daily.  01/07/21   [provider]  famotidine (PEPCID) 20 MG tablet Take 1 tablet (20 mg total) by mouth 2 (two) times daily. Patient taking differently: Take 20 mg by mouth daily as needed for heartburn or indigestion. 03/29/17   Palumbo, April, MD  Multiple Vitamin (MULTIVITAMIN WITH MINERALS) TABS tablet Take 1 tablet by mouth daily.    [provider]  Omega-3 1000 MG CAPS Take 1,000 mg by mouth daily.    [provider]  ondansetron (ZOFRAN ODT) 4 MG disintegrating tablet Take 1 tablet  (4 mg total) by mouth every 8 (eight) hours as needed for nausea or vomiting. 02/02/21   Caccavale, Sophia, PA-C  QUEtiapine (SEROQUEL) 25 MG tablet Take 25 mg by mouth at bedtime. 01/07/21   [provider]  TRULICITY 6.22 WL/7.9GX SOPN Inject 0.75 mg into the skin once a week. 01/01/21   [provider]    Physical Exam: Vitals:   02/04/21 1400 02/04/21 1430 02/04/21 1500 02/04/21 1530  BP: (!) 81/56 (!) 91/50 (!) 111/51 97/79  Pulse: (!) 102 (!) 103 99 97  Resp: (!) 23 (!) 27 17 18   Temp:      TempSrc:      SpO2: 96% 96% 96% 98%  Weight:      Height:        Constitutional: NAD, calm, comfortable Vitals:   02/04/21 1400 02/04/21 1430 02/04/21 1500 02/04/21 1530  BP: (!) 81/56 (!) 91/50 (!) 111/51 97/79  Pulse: (!) 102 (!) 103 99 97  Resp: (!) 23 (!) 27 17 18   Temp:      TempSrc:      SpO2: 96% 96% 96% 98%  Weight:      Height:       Eyes: PERRL, lids and conjunctivae normal ENMT: Mucous membranes are dry. Posterior pharynx clear of any exudate or lesions.Normal dentition.  Neck: normal, supple, no masses, no thyromegaly Respiratory: clear to auscultation bilaterally, no wheezing, no crackles. Normal respiratory effort. No accessory muscle use.  Cardiovascular: Regular rate and rhythm, no murmurs / rubs / gallops. No extremity edema. 2+ pedal pulses. No carotid bruits.  Abdomen: mild tenderness on periumbilical area no rebound no guarding, no masses palpated. No hepatosplenomegaly. Bowel sounds positive.  Musculoskeletal: no clubbing / cyanosis. No joint deformity upper and lower extremities. Good ROM, no contractures. Normal muscle tone.  Skin: no rashes, lesions, ulcers. No induration Neurologic: CN 2-12 grossly intact. Sensation intact, DTR normal. Strength 5/5 in all 4.  Psychiatric: Normal judgment and insight. Alert and oriented x 3. Normal mood.    Labs on Admission: I have personally reviewed following labs and imaging studies  CBC: Recent Labs   Lab 02/02/21 0848 02/04/21 0530  WBC 16.2* 9.6  NEUTROABS 14.3* 8.8*  HGB 16.1* 14.5  HCT 48.2* 44.0  MCV 92.5 92.8  PLT 270 211*   Basic Metabolic Panel: Recent Labs  Lab 02/02/21 0848 02/04/21 0530  NA 139 131*  K 4.6 3.4*  CL 105 103  CO2 24 19*  GLUCOSE 132* 168*  BUN 21 14  CREATININE 0.93 0.90  CALCIUM 9.4 8.3*   GFR: Estimated Creatinine Clearance: 62.4 mL/min (by C-G formula based on SCr of 0.9 mg/dL). Liver Function Tests: Recent Labs  Lab 02/02/21 0848 02/04/21 0530  AST 38 131*  ALT 32 148*  ALKPHOS 61 62  BILITOT 1.0 0.8  PROT 8.1 6.4*  ALBUMIN 4.7 3.8   Recent Labs  Lab 02/02/21 0848  LIPASE 36   No  results for input(s): AMMONIA in the last 168 hours. Coagulation Profile: No results for input(s): INR, PROTIME in the last 168 hours. Cardiac Enzymes: No results for input(s): CKTOTAL, CKMB, CKMBINDEX, TROPONINI in the last 168 hours. BNP (last 3 results) No results for input(s): PROBNP in the last 8760 hours. HbA1C: No results for input(s): HGBA1C in the last 72 hours. CBG: Recent Labs  Lab 02/02/21 0848  GLUCAP 137*   Lipid Profile: No results for input(s): CHOL, HDL, LDLCALC, TRIG, CHOLHDL, LDLDIRECT in the last 72 hours. Thyroid Function Tests: No results for input(s): TSH, T4TOTAL, FREET4, T3FREE, THYROIDAB in the last 72 hours. Anemia Panel: No results for input(s): VITAMINB12, FOLATE, FERRITIN, TIBC, IRON, RETICCTPCT in the last 72 hours. Urine analysis:    Component Value Date/Time   COLORURINE YELLOW 02/04/2021 0519   APPEARANCEUR CLEAR 02/04/2021 0519   LABSPEC 1.016 02/04/2021 0519   PHURINE 5.0 02/04/2021 0519   GLUCOSEU NEGATIVE 02/04/2021 0519   HGBUR NEGATIVE 02/04/2021 0519   HGBUR negative 01/19/2010 1432   BILIRUBINUR NEGATIVE 02/04/2021 0519   KETONESUR NEGATIVE 02/04/2021 0519   PROTEINUR NEGATIVE 02/04/2021 0519   UROBILINOGEN 0.2 01/04/2014 0857   NITRITE NEGATIVE 02/04/2021 0519   LEUKOCYTESUR NEGATIVE  02/04/2021 0519    Radiological Exams on Admission: DG Chest 2 View  Result Date: 02/04/2021 CLINICAL DATA:  Shortness of breath EXAM: CHEST - 2 VIEW COMPARISON:  03/28/2017 FINDINGS: Normal heart size and mediastinal contours. No acute infiltrate or edema. No effusion or pneumothorax. No acute osseous findings. Artifact from EKG leads. IMPRESSION: Negative chest. Electronically Signed   By: Monte Fantasia M.D.   On: 02/04/2021 05:57   MR Lumbar Spine W Wo Contrast  Result Date: 02/04/2021 CLINICAL DATA:  Low back pain, febrile EXAM: MRI LUMBAR SPINE WITHOUT AND WITH CONTRAST TECHNIQUE: Multiplanar and multiecho pulse sequences of the lumbar spine were obtained without and with intravenous contrast. CONTRAST:  7.34mL GADAVIST GADOBUTROL 1 MMOL/ML IV SOLN COMPARISON:  2015 FINDINGS: Segmentation:  Standard. Alignment:  Anteroposterior alignment is maintained. Vertebrae: Vertebral body heights are preserved. Vertebral body hemangioma is partially imaged at T11. Smaller hemangioma at L4. No marrow edema. Conus medullaris and cauda equina: Conus extends to the L1-L2 level. Conus and cauda equina appear normal. No abnormal intrathecal enhancement. Paraspinal and other soft tissues: Unremarkable. Disc levels: L1-L2:  Minimal disc bulge.  No canal or foraminal stenosis. L2-L3: Minimal disc bulge. Punctate left foraminal annular fissure. No canal or foraminal stenosis. L3-L4: Minimal disc bulge with small left foraminal protrusion. No canal or right foraminal stenosis. Minor left foraminal stenosis. L4-L5: Minimal disc bulge with small right annular fissure. No canal or foraminal stenosis. L5-S1: Disc bulge with endplate osteophytic ridging eccentric to the left. No canal stenosis. Slight effacement of the left subarticular recess. Left greater than right foraminal narrowing with disc/osteophyte contacting the exiting nerve roots bilaterally. IMPRESSION: No evidence of discitis/osteomyelitis. Mild degenerative  changes as detailed above without substantial progression since 2015. Electronically Signed   By: Macy Mis M.D.   On: 02/04/2021 11:20   CT ABDOMEN PELVIS W CONTRAST  Result Date: 02/04/2021 CLINICAL DATA:  Abdominal pain, fever EXAM: CT ABDOMEN AND PELVIS WITH CONTRAST TECHNIQUE: Multidetector CT imaging of the abdomen and pelvis was performed using the standard protocol following bolus administration of intravenous contrast. CONTRAST:  172mL OMNIPAQUE IOHEXOL 300 MG/ML  SOLN COMPARISON:  02/02/2021 FINDINGS: Lower chest: No acute abnormality. Hepatobiliary: No solid liver abnormality is seen. No gallstones, gallbladder wall thickening, or biliary dilatation.  Pancreas: Unremarkable. No pancreatic ductal dilatation or surrounding inflammatory changes. Spleen: Normal in size without significant abnormality. Adrenals/Urinary Tract: Adrenal glands are unremarkable. Tiny nonobstructive calculus of the inferior pole of the left kidney (series 8, image 39). No other urinary tract calculi. No hydronephrosis. Bladder is unremarkable. Stomach/Bowel: Stomach is within normal limits. Appendix appears normal. No evidence of bowel wall thickening, distention, or inflammatory changes. Vascular/Lymphatic: No significant vascular findings are present. No enlarged abdominal or pelvic lymph nodes. Reproductive: No mass or other significant abnormality. Other: No abdominal wall hernia or abnormality. No abdominopelvic ascites. Musculoskeletal: No acute or significant osseous findings. IMPRESSION: 1. No acute CT findings of the abdomen or pelvis to explain abdominal pain. 2. Tiny nonobstructive calculus of the inferior pole of the left kidney. Electronically Signed   By: Eddie Candle M.D.   On: 02/04/2021 12:15    EKG: Independently reviewed.  Sinus tachycardia, similar QRS and ST changes as in 2018.  Assessment/Plan Active Problems:   Hypotension  (please populate well all problems here in Problem List. (For example,  if patient is on BP meds at home and you resume or decide to hold them, it is a problem that needs to be her. Same for CAD, COPD, HLD and so on)  Acute gastroenteritis -Suspect viral, given the initial leukocytosis 2 days ago resolved without significant intervention.  CT abdomen showed no acute findings.  Send stool PCR panel. -Continue IV fluid for today given labile blood pressure performance in ED. -Supportive care up until, patient received 1 dose of Lomotil in ED and her last diarrhea was more than 8 hours ago.  Acute normal anion gap metabolic acidosis -From persistent diarrhea, switch IV fluids to bicarb drip.  Recheck bicarb level tomorrow.  Acute transaminitis -Rule out acute hepatitis -Hold statin.  Hyponatremia -Hypovolemia, likely from severe diarrhea, IV fluid as above.  Fever -SIRS but no significant sepsis.  Suspect this is viral source of gastroenteritis.  IIDM with hyperglycemia -Sliding scale for now.  Anxiety/depression -Continue SSRI   DVT prophylaxis: Lovenox Code Status: Full Code Family Communication: None at bedside Disposition Plan: Tele obs Consults called: None Admission status: Tele obs   Lequita Halt MD [facility] Pager [phone]  02/04/2021, 4:01 PM

## 2021-02-04 NOTE — ED Notes (Signed)
Patient transported to X-ray 

## 2021-02-04 NOTE — ED Notes (Signed)
Ambulating to restroom at this time

## 2021-02-04 NOTE — ED Provider Notes (Signed)
Accepted handoff at shift change from [facility]. Please see prior provider note for more detail.   Briefly: Patient is 65 y.o.  Patient is a diabetic 64 year old female with a history of hypertension and HLD presenting today for abdominal pain, back pain nausea, vomiting, diarrhea.  She denies any bloody or bilious emesis.  No bloody stools.  She has had a low-grade fever.  She states that she was seen 3/24 Elvina Sidle and was discharged home after reassuring work-up.  She states that the diarrhea has become worse and is more frequent and watery like.  She is having low back pain  DDX: concern for colitis versus spinal epidural abscess versus viral illness  Plan: We will obtain ESR, CRP and reevaluate.  Patient is currently pain-free.  Does seem to have some lower back tenderness to palpation.  No abdominal tenderness on my reexamination.    Physical Exam  BP 124/65   Pulse 98   Temp 100.1 F (37.8 C) (Oral)   Resp (!) 26   Ht '5\' 3"'  (1.6 m)   Wt 75.8 kg   SpO2 95%   BMI 29.58 kg/m   Physical Exam Vitals and nursing note reviewed.  Constitutional:      General: She is not in acute distress.    Comments: Pleasant 65 year old female in no acute distress sitting comfortably in bed denies any pain.  HENT:     Head: Normocephalic and atraumatic.     Nose: Nose normal.     Mouth/Throat:     Comments: Very mildly dry oral mucosa Eyes:     General: No scleral icterus. Cardiovascular:     Rate and Rhythm: Normal rate and regular rhythm.     Pulses: Normal pulses.     Heart sounds: Normal heart sounds.  Pulmonary:     Effort: Pulmonary effort is normal. No respiratory distress.     Breath sounds: No wheezing.  Abdominal:     Palpations: Abdomen is soft.     Tenderness: There is no abdominal tenderness.     Comments: Abdomen is soft, nontender, no guarding or rebound, no CVA tenderness.  Musculoskeletal:     Cervical back: Normal range of motion.     Right lower leg: No edema.      Left lower leg: No edema.     Comments: No tenderness palpation of the midline L, T, C-spine.  Full range of motion.  Ambulatory without difficulty.  Good coordination of movement and good strength in all 4 extremities.  Skin:    General: Skin is warm and dry.     Capillary Refill: Capillary refill takes less than 2 seconds.  Neurological:     Mental Status: She is alert. Mental status is at baseline.  Psychiatric:        Mood and Affect: Mood normal.        Behavior: Behavior normal.      ED Course/Procedures   Clinical Course as of 02/04/21 1520  Sat Feb 04, 2021  1132 MRI w and W/o gad contrast without evidence of SEA or discitis  IMPRESSION: No evidence of discitis/osteomyelitis.  Mild degenerative changes as detailed above without substantial progression since 2015.   [WF]  9147 CT scan reviewed. NO evidence of intraabdominal infection.    IMPRESSION: 1. No acute CT findings of the abdomen or pelvis to explain abdominal pain. 2. Tiny nonobstructive calculus of the inferior pole of the left kidney.   [WF]    Clinical Course User Index [  WF] Tedd Sias, [place]    .Critical Care Performed by: Tedd Sias, PA Authorized by: Tedd Sias, PA   Critical care provider statement:    Critical care time (minutes):  35   Critical care time was exclusive of:  Separately billable procedures and treating other patients and teaching time   Critical care was necessary to treat or prevent imminent or life-threatening deterioration of the following conditions: Severe dehydration resulting in hypotension requiring multiple fluid boluses.   Critical care was time spent personally by me on the following activities:  Discussions with consultants, evaluation of patient's response to treatment, examination of patient, review of old charts, re-evaluation of patient's condition, pulse oximetry, ordering and review of radiographic studies, ordering and review of laboratory  studies and ordering and performing treatments and interventions   I assumed direction of critical care for this patient from another provider in my specialty: no     Results for orders placed or performed during the hospital encounter of 02/04/21  Lactic acid, plasma  Result Value Ref Range   Lactic Acid, Venous 1.5 0.5 - 1.9 mmol/L  Comprehensive metabolic panel  Result Value Ref Range   Sodium 131 (L) 135 - 145 mmol/L   Potassium 3.4 (L) 3.5 - 5.1 mmol/L   Chloride 103 98 - 111 mmol/L   CO2 19 (L) 22 - 32 mmol/L   Glucose, Bld 168 (H) 70 - 99 mg/dL   BUN 14 8 - 23 mg/dL   Creatinine, Ser 0.90 0.44 - 1.00 mg/dL   Calcium 8.3 (L) 8.9 - 10.3 mg/dL   Total Protein 6.4 (L) 6.5 - 8.1 g/dL   Albumin 3.8 3.5 - 5.0 g/dL   AST 131 (H) 15 - 41 U/L   ALT 148 (H) 0 - 44 U/L   Alkaline Phosphatase 62 38 - 126 U/L   Total Bilirubin 0.8 0.3 - 1.2 mg/dL   GFR, Estimated >60 >60 mL/min   Anion gap 9 5 - 15  CBC with Differential  Result Value Ref Range   WBC 9.6 4.0 - 10.5 K/uL   RBC 4.74 3.87 - 5.11 MIL/uL   Hemoglobin 14.5 12.0 - 15.0 g/dL   HCT 44.0 36.0 - 46.0 %   MCV 92.8 80.0 - 100.0 fL   MCH 30.6 26.0 - 34.0 pg   MCHC 33.0 30.0 - 36.0 g/dL   RDW 13.5 11.5 - 15.5 %   Platelets 148 (L) 150 - 400 K/uL   nRBC 0.0 0.0 - 0.2 %   Neutrophils Relative % 92 %   Neutro Abs 8.8 (H) 1.7 - 7.7 K/uL   Lymphocytes Relative 5 %   Lymphs Abs 0.5 (L) 0.7 - 4.0 K/uL   Monocytes Relative 1 %   Monocytes Absolute 0.1 0.1 - 1.0 K/uL   Eosinophils Relative 1 %   Eosinophils Absolute 0.1 0.0 - 0.5 K/uL   Basophils Relative 0 %   Basophils Absolute 0.0 0.0 - 0.1 K/uL   Immature Granulocytes 1 %   Abs Immature Granulocytes 0.07 0.00 - 0.07 K/uL  Urinalysis, Routine w reflex microscopic Urine, Clean Catch  Result Value Ref Range   Color, Urine YELLOW YELLOW   APPearance CLEAR CLEAR   Specific Gravity, Urine 1.016 1.005 - 1.030   pH 5.0 5.0 - 8.0   Glucose, UA NEGATIVE NEGATIVE mg/dL   Hgb urine  dipstick NEGATIVE NEGATIVE   Bilirubin Urine NEGATIVE NEGATIVE   Ketones, ur NEGATIVE NEGATIVE mg/dL   Protein, ur NEGATIVE  NEGATIVE mg/dL   Nitrite NEGATIVE NEGATIVE   Leukocytes,Ua NEGATIVE NEGATIVE   RBC / HPF 0-5 0 - 5 RBC/hpf   WBC, UA 0-5 0 - 5 WBC/hpf   Bacteria, UA NONE SEEN NONE SEEN   Squamous Epithelial / LPF 0-5 0 - 5   Mucus PRESENT    CT ABDOMEN PELVIS WO CONTRAST  Result Date: 02/02/2021 CLINICAL DATA:  Acute onset of abdominal pain and nausea and vomiting this morning. EXAM: CT ABDOMEN AND PELVIS WITHOUT CONTRAST TECHNIQUE: Multidetector CT imaging of the abdomen and pelvis was performed following the standard protocol without IV contrast. COMPARISON:  None. FINDINGS: Lower chest: No acute findings. Hepatobiliary: No mass visualized on this unenhanced exam. Gallbladder is unremarkable. No evidence of biliary ductal dilatation. Pancreas: No mass or inflammatory process visualized on this unenhanced exam. Spleen:  Within normal limits in size. Adrenals/Urinary tract: A 2 mm calculus is noted in the lower pole of the left kidney. No evidence of ureteral calculi or hydronephrosis. A sub-cm fat attenuation lesion is also seen in the posterior interpolar region of the left kidney, consistent with a tiny benign angiomyolipoma. Stomach/Bowel: No evidence of obstruction, inflammatory process, or abnormal fluid collections. Normal appendix visualized. Vascular/Lymphatic: No pathologically enlarged lymph nodes identified. No evidence of abdominal aortic aneurysm. Reproductive:  No mass or other significant abnormality. Other:  None. Musculoskeletal:  No suspicious bone lesions identified. IMPRESSION: Tiny left renal calculus. No evidence of ureteral calculi, hydronephrosis, or other acute findings. Tiny benign left renal angiomyolipoma. Electronically Signed   By: Marlaine Hind M.D.   On: 02/02/2021 11:40   DG Chest 2 View  Result Date: 02/04/2021 CLINICAL DATA:  Shortness of breath EXAM: CHEST  - 2 VIEW COMPARISON:  03/28/2017 FINDINGS: Normal heart size and mediastinal contours. No acute infiltrate or edema. No effusion or pneumothorax. No acute osseous findings. Artifact from EKG leads. IMPRESSION: Negative chest. Electronically Signed   By: Monte Fantasia M.D.   On: 02/04/2021 05:57    MDM   Patient is 65 year old female presented today with nausea vomiting diarrhea seems to have primarily turned to diarrhea at this point.  She is having so low back pain and some abdominal aches.  She has been given morphine and signout is pending CRP and ESR.  If these are significantly elevated and repeat examination is concerning for spinal epidural abscess will MRI with and without contrast. She has already been given Lomotil, Levsin and morphine.  8:20 AM patient reassessed.  She is completely asymptomatic at this time.  She states she has had a couple bowel movements today already however.  She denies any pain at all after the morphine.  She had a dose of 4 of morphine.  I rechecked her temperature which is 99.9.  Her kidney function is normal.  We will provide her with 1 dose of IV ketorolac 15 mg plus some IV fluids given her dehydrated appearance and numerous episodes of diarrhea.  No recent antibiotics have low suspicion for C. difficile. She also has no lumbar tenderness palpation.  I very low suspicion for spinal epidural abscess and apart from her diabetes which is well controlled with an A1c of approximately 6 she has no predisposing factors.  No history of IV drug use.  We will follow up on ESR CRP per prior provider plan.   10:10 AM patient reevaluated given that she has become tachycardic.  I rechecked her temperature p.o. and she is 102.9.  She is somewhat tachycardic and tachypneic likely  due to her fever.  Will treat with Tylenol. I discussed this case with my attending physician who cosigned this note including patient's presenting symptoms, physical exam, and planned diagnostics and  interventions. Attending physician stated agreement with plan or made changes to plan which were implemented.  Plan at this time is to MRI lumbar spine with and without contrast in keeping with prior provider plan.  We will also obtain a CT abdomen pelvis with contrast to evaluate for intra-abdominal infection.  Patient's CT scan from 2 days ago was reviewed.  Gallbladder seem to be without abnormality at that time.  However CT scan at that time was without contrast.   Patient was continuously reassessed at frequent intervals throughout ER stay.  Approximately 1215 patient was reevaluated by myself and found to have a blood pressure of 80/50.  This was rechecked multiple times blood pressures seem to be between 80 and 90 systolic.  She is already received 1 L normal saline.  Will provide with an additional 1 L bolus.   Patient admitted to Dr. Roosevelt Locks.   Debra Morrison was evaluated in Emergency Department on 02/04/2021 for the symptoms described in the history of present illness. She was evaluated in the context of the global COVID-19 pandemic, which necessitated consideration that the patient might be at risk for infection with the SARS-CoV-2 virus that causes COVID-19. Institutional protocols and algorithms that pertain to the evaluation of patients at risk for COVID-19 are in a state of rapid change based on information released by regulatory bodies including the CDC and federal and state organizations. These policies and algorithms were followed during the patient's care in the ED.    Debra Morrison [place], [place] 02/04/21 1621    Fredia Sorrow, MD 02/05/21 1550

## 2021-02-04 NOTE — Progress Notes (Signed)
02/04/2021 Patient transfer from the emergency room to 2W24. She is alert oriented to person, place, time and situation. Patient skin was assess, it was intact. She was place on enteric precaution and on telemetry when arrive on the unit [facility].

## 2021-02-04 NOTE — ED Notes (Signed)
Made provider aware that some of the medications that were ordered were not here in the department

## 2021-02-05 ENCOUNTER — Observation Stay (HOSPITAL_COMMUNITY): Payer: Medicare Other

## 2021-02-05 DIAGNOSIS — Z20822 Contact with and (suspected) exposure to covid-19: Secondary | ICD-10-CM | POA: Diagnosis present

## 2021-02-05 DIAGNOSIS — I959 Hypotension, unspecified: Secondary | ICD-10-CM

## 2021-02-05 DIAGNOSIS — E785 Hyperlipidemia, unspecified: Secondary | ICD-10-CM | POA: Diagnosis present

## 2021-02-05 DIAGNOSIS — Z8249 Family history of ischemic heart disease and other diseases of the circulatory system: Secondary | ICD-10-CM | POA: Diagnosis not present

## 2021-02-05 DIAGNOSIS — Z7984 Long term (current) use of oral hypoglycemic drugs: Secondary | ICD-10-CM | POA: Diagnosis not present

## 2021-02-05 DIAGNOSIS — I10 Essential (primary) hypertension: Secondary | ICD-10-CM | POA: Diagnosis present

## 2021-02-05 DIAGNOSIS — K529 Noninfective gastroenteritis and colitis, unspecified: Secondary | ICD-10-CM

## 2021-02-05 DIAGNOSIS — Z885 Allergy status to narcotic agent status: Secondary | ICD-10-CM | POA: Diagnosis not present

## 2021-02-05 DIAGNOSIS — E119 Type 2 diabetes mellitus without complications: Secondary | ICD-10-CM | POA: Diagnosis present

## 2021-02-05 DIAGNOSIS — E872 Acidosis: Secondary | ICD-10-CM | POA: Diagnosis present

## 2021-02-05 DIAGNOSIS — R6510 Systemic inflammatory response syndrome (SIRS) of non-infectious origin without acute organ dysfunction: Secondary | ICD-10-CM | POA: Diagnosis present

## 2021-02-05 DIAGNOSIS — D696 Thrombocytopenia, unspecified: Secondary | ICD-10-CM | POA: Diagnosis present

## 2021-02-05 DIAGNOSIS — R7989 Other specified abnormal findings of blood chemistry: Secondary | ICD-10-CM | POA: Diagnosis not present

## 2021-02-05 DIAGNOSIS — Z79899 Other long term (current) drug therapy: Secondary | ICD-10-CM | POA: Diagnosis not present

## 2021-02-05 DIAGNOSIS — E861 Hypovolemia: Secondary | ICD-10-CM | POA: Diagnosis present

## 2021-02-05 DIAGNOSIS — E860 Dehydration: Secondary | ICD-10-CM | POA: Diagnosis present

## 2021-02-05 DIAGNOSIS — F32A Depression, unspecified: Secondary | ICD-10-CM | POA: Diagnosis present

## 2021-02-05 DIAGNOSIS — R000 Tachycardia, unspecified: Secondary | ICD-10-CM | POA: Diagnosis present

## 2021-02-05 DIAGNOSIS — F17210 Nicotine dependence, cigarettes, uncomplicated: Secondary | ICD-10-CM | POA: Diagnosis present

## 2021-02-05 DIAGNOSIS — F419 Anxiety disorder, unspecified: Secondary | ICD-10-CM | POA: Diagnosis present

## 2021-02-05 DIAGNOSIS — A084 Viral intestinal infection, unspecified: Secondary | ICD-10-CM | POA: Diagnosis present

## 2021-02-05 DIAGNOSIS — E876 Hypokalemia: Secondary | ICD-10-CM | POA: Diagnosis present

## 2021-02-05 DIAGNOSIS — E871 Hypo-osmolality and hyponatremia: Secondary | ICD-10-CM | POA: Diagnosis present

## 2021-02-05 DIAGNOSIS — Z888 Allergy status to other drugs, medicaments and biological substances status: Secondary | ICD-10-CM | POA: Diagnosis not present

## 2021-02-05 LAB — CBC WITH DIFFERENTIAL/PLATELET
Abs Immature Granulocytes: 0.09 10*3/uL — ABNORMAL HIGH (ref 0.00–0.07)
Basophils Absolute: 0 10*3/uL (ref 0.0–0.1)
Basophils Relative: 0 %
Eosinophils Absolute: 0.2 10*3/uL (ref 0.0–0.5)
Eosinophils Relative: 1 %
HCT: 39.8 % (ref 36.0–46.0)
Hemoglobin: 13.1 g/dL (ref 12.0–15.0)
Immature Granulocytes: 1 %
Lymphocytes Relative: 7 %
Lymphs Abs: 0.9 10*3/uL (ref 0.7–4.0)
MCH: 30.3 pg (ref 26.0–34.0)
MCHC: 32.9 g/dL (ref 30.0–36.0)
MCV: 92.1 fL (ref 80.0–100.0)
Monocytes Absolute: 0.5 10*3/uL (ref 0.1–1.0)
Monocytes Relative: 4 %
Neutro Abs: 10.9 10*3/uL — ABNORMAL HIGH (ref 1.7–7.7)
Neutrophils Relative %: 87 %
Platelets: 120 10*3/uL — ABNORMAL LOW (ref 150–400)
RBC: 4.32 MIL/uL (ref 3.87–5.11)
RDW: 13.9 % (ref 11.5–15.5)
WBC: 12.7 10*3/uL — ABNORMAL HIGH (ref 4.0–10.5)
nRBC: 0 % (ref 0.0–0.2)

## 2021-02-05 LAB — GLUCOSE, CAPILLARY
Glucose-Capillary: 73 mg/dL (ref 70–99)
Glucose-Capillary: 134 mg/dL — ABNORMAL HIGH (ref 70–99)
Glucose-Capillary: 105 mg/dL — ABNORMAL HIGH (ref 70–99)
Glucose-Capillary: 97 mg/dL (ref 70–99)

## 2021-02-05 LAB — COMPREHENSIVE METABOLIC PANEL
ALT: 561 U/L — ABNORMAL HIGH (ref 0–44)
ALT: 442 U/L — ABNORMAL HIGH (ref 0–44)
AST: 452 U/L — ABNORMAL HIGH (ref 15–41)
AST: 244 U/L — ABNORMAL HIGH (ref 15–41)
Albumin: 2.9 g/dL — ABNORMAL LOW (ref 3.5–5.0)
Albumin: 3 g/dL — ABNORMAL LOW (ref 3.5–5.0)
Alkaline Phosphatase: 60 U/L (ref 38–126)
Alkaline Phosphatase: 78 U/L (ref 38–126)
Anion gap: 9 (ref 5–15)
Anion gap: 6 (ref 5–15)
BUN: 17 mg/dL (ref 8–23)
BUN: 14 mg/dL (ref 8–23)
CO2: 23 mmol/L (ref 22–32)
CO2: 23 mmol/L (ref 22–32)
Calcium: 7.5 mg/dL — ABNORMAL LOW (ref 8.9–10.3)
Calcium: 8.1 mg/dL — ABNORMAL LOW (ref 8.9–10.3)
Chloride: 104 mmol/L (ref 98–111)
Chloride: 111 mmol/L (ref 98–111)
Creatinine, Ser: 1.11 mg/dL — ABNORMAL HIGH (ref 0.44–1.00)
Creatinine, Ser: 0.84 mg/dL (ref 0.44–1.00)
GFR, Estimated: 56 mL/min — ABNORMAL LOW (ref >60)
GFR, Estimated: >60 mL/min (ref >60)
Glucose, Bld: 128 mg/dL — ABNORMAL HIGH (ref 70–99)
Glucose, Bld: 112 mg/dL — ABNORMAL HIGH (ref 70–99)
Potassium: 3.3 mmol/L — ABNORMAL LOW (ref 3.5–5.1)
Potassium: 3.8 mmol/L (ref 3.5–5.1)
Sodium: 136 mmol/L (ref 135–145)
Sodium: 140 mmol/L (ref 135–145)
Total Bilirubin: 1 mg/dL (ref 0.3–1.2)
Total Bilirubin: 0.7 mg/dL (ref 0.3–1.2)
Total Protein: 5.2 g/dL — ABNORMAL LOW (ref 6.5–8.1)
Total Protein: 5.5 g/dL — ABNORMAL LOW (ref 6.5–8.1)

## 2021-02-05 LAB — C DIFFICILE QUICK SCREEN W PCR REFLEX
C Diff antigen: NEGATIVE
C Diff interpretation: NOT DETECTED
C Diff toxin: NEGATIVE

## 2021-02-05 LAB — MAGNESIUM: Magnesium: 1.8 mg/dL (ref 1.7–2.4)

## 2021-02-05 MED ORDER — POTASSIUM CHLORIDE CRYS ER 10 MEQ PO TBCR
10.0000 meq | EXTENDED_RELEASE_TABLET | Freq: Once | ORAL | Status: AC
  Administered 2021-02-05: 10 meq via ORAL
  Filled 2021-02-05: qty 1

## 2021-02-05 MED ORDER — CALCIUM CARBONATE 1250 (500 CA) MG PO TABS
1.0000 | ORAL_TABLET | Freq: Two times a day (BID) | ORAL | Status: DC
  Administered 2021-02-05 – 2021-02-06 (×2): 500 mg via ORAL
  Filled 2021-02-05: qty 1

## 2021-02-05 MED ORDER — SODIUM CHLORIDE 0.9 % IV BOLUS
500.0000 mL | Freq: Once | INTRAVENOUS | Status: AC
  Administered 2021-02-05: 500 mL via INTRAVENOUS

## 2021-02-05 MED ORDER — POTASSIUM CHLORIDE IN NACL 20-0.9 MEQ/L-% IV SOLN
INTRAVENOUS | Status: DC
  Filled 2021-02-05 (×2): qty 1000

## 2021-02-05 NOTE — Progress Notes (Signed)
MD notified of abnormal AM labs. Pt has not had BM overnight yet, so stool PCR specimen has not been sent to lab.

## 2021-02-05 NOTE — Progress Notes (Signed)
Patient ID: Debra Morrison, female   DOB: [birth year], 65 y.o.   MRN: [id number]  PROGRESS NOTE    Jessic Standifer  JQB:[id number] DOB: [birth year] DOA: 02/04/2021 PCP: Jonathon Jordan, MD   Brief Narrative:  65 y.o. female with medical history significant of IIDM, HTN, HLD, anxiety/depression, presented with 3 days of nauseous vomiting diarrhea and fever.  Presentation, she had F temperature of 102.6 with blood pressure on the lower side and WBC of 9.8 along with elevated AST and ALT.  CT of the abdomen with contrast showed no significant etiology.  She was treated with IV fluids.  Assessment & Plan:   Acute gastroenteritis presenting with nausea, vomiting, diarrhea and fever -Probable viral in origin.  CT of the abdomen showed no acute abnormality  -Treated with IV fluids.  Symptoms improving.  Stool for GI PCR and C. difficile are pending -Decrease normal saline to 100 cc an hour along with potassium supplementation  Hypotension -Possibly secondary to above.  Blood pressure still on the lower side but stable.  IV fluids as above  Non-anion gap metabolic acidosis -Probably from diarrhea.  Improved.  Monitor  Hyponatremia  -Improved  Hypokalemia--continue supplementation in IV fluid.  Fever/SIRS -No fever since presentation.  No signs of sepsis.  Leukocytosis -Possibly from above.  Monitor   Thrombocytopenia  -Possibly from above.  No signs of bleeding.  Monitor  Elevated LFTs -Possibly from hypotension.  LFTs worsening.  Monitor.  Hepatitis panel negative.  Will check right upper quadrant ultrasound.  Anxiety/depression -Continue SSRI     DVT prophylaxis: Lovenox Code Status: Full Family Communication: None at bedside Disposition Plan: Status is: Observation  The patient will require care spanning > 2 midnights and should be moved to inpatient because: Inpatient level of care appropriate due to severity of illness  Dispo: The patient is from: Home               Anticipated d/c is to: Home              Patient currently is not medically stable to d/c.   Difficult to place patient No  Consultants: None  Procedures: None  Antimicrobials: None   Subjective: Patient seen and examined at bedside.  States that her diarrhea is improving.  Nausea also improving.  Currently no vomiting.  Denies worsening abdominal pain, overnight fever.  Objective: Vitals:   02/04/21 1940 02/04/21 2209 02/05/21 0515 02/05/21 0659  BP:  94/61 (!) 90/56 104/66  Pulse:  79 68   Resp: (!) 21 18 16    Temp: 98.5 F (36.9 C) 98.9 F (37.2 C) 98 F (36.7 C)   TempSrc: Oral Oral Oral   SpO2:  97% 96%   Weight:      Height:        Intake/Output Summary (Last 24 hours) at 02/05/2021 0934 Last data filed at 02/04/2021 1621 Gross per 24 hour  Intake 2000 ml  Output -  Net 2000 ml   Filed Weights   02/04/21 0609  Weight: 75.8 kg    Examination:  General exam: Appears calm and comfortable.  Currently on room air Respiratory system: Bilateral decreased breath sounds at bases Cardiovascular system: S1 & S2 heard, Rate controlled Gastrointestinal system: Abdomen is nondistended, soft and nontender. Normal bowel sounds heard. Extremities: No cyanosis, clubbing, edema  Central nervous system: Alert and oriented. No focal neurological deficits. Moving extremities Skin: No rashes, lesions or ulcers Psychiatry: Flat affect    Data Reviewed: I have personally reviewed following  labs and imaging studies  CBC: Recent Labs  Lab 02/02/21 0848 02/04/21 0530 02/05/21 0040  WBC 16.2* 9.6 12.7*  NEUTROABS 14.3* 8.8* 10.9*  HGB 16.1* 14.5 13.1  HCT 48.2* 44.0 39.8  MCV 92.5 92.8 92.1  PLT 270 148* 546*   Basic Metabolic Panel: Recent Labs  Lab 02/02/21 0848 02/04/21 0530 02/05/21 0040  NA 139 131* 136  K 4.6 3.4* 3.3*  CL 105 103 104  CO2 24 19* 23  GLUCOSE 132* 168* 128*  BUN 21 14 17   CREATININE 0.93 0.90 1.11*  CALCIUM 9.4 8.3* 7.5*    GFR: Estimated Creatinine Clearance: 50.6 mL/min (A) (by C-G formula based on SCr of 1.11 mg/dL (H)). Liver Function Tests: Recent Labs  Lab 02/02/21 0848 02/04/21 0530 02/05/21 0040  AST 38 131* 452*  ALT 32 148* 561*  ALKPHOS 61 62 60  BILITOT 1.0 0.8 1.0  PROT 8.1 6.4* 5.2*  ALBUMIN 4.7 3.8 2.9*   Recent Labs  Lab 02/02/21 0848  LIPASE 36   No results for input(s): AMMONIA in the last 168 hours. Coagulation Profile: No results for input(s): INR, PROTIME in the last 168 hours. Cardiac Enzymes: Recent Labs  Lab 02/04/21 1709  CKTOTAL 665*   BNP (last 3 results) No results for input(s): PROBNP in the last 8760 hours. HbA1C: Recent Labs    02/04/21 1709  HGBA1C 5.7*   CBG: Recent Labs  Lab 02/02/21 0848 02/04/21 1701 02/04/21 2206 02/05/21 0724  GLUCAP 137* 120* 131* 73   Lipid Profile: No results for input(s): CHOL, HDL, LDLCALC, TRIG, CHOLHDL, LDLDIRECT in the last 72 hours. Thyroid Function Tests: No results for input(s): TSH, T4TOTAL, FREET4, T3FREE, THYROIDAB in the last 72 hours. Anemia Panel: No results for input(s): VITAMINB12, FOLATE, FERRITIN, TIBC, IRON, RETICCTPCT in the last 72 hours. Sepsis Labs: Recent Labs  Lab 02/04/21 0529  LATICACIDVEN 1.5    Recent Results (from the past 240 hour(s))  Resp Panel by RT-PCR (Flu A&B, Covid) Nasopharyngeal Swab     Status: None   Collection Time: 02/02/21  8:33 AM   Specimen: Nasopharyngeal Swab; Nasopharyngeal(NP) swabs in vial transport medium  Result Value Ref Range Status   SARS Coronavirus 2 by RT PCR NEGATIVE NEGATIVE Final    Comment: (NOTE) SARS-CoV-2 target nucleic acids are NOT DETECTED.  The SARS-CoV-2 RNA is generally detectable in upper respiratory specimens during the acute phase of infection. The lowest concentration of SARS-CoV-2 viral copies this assay can detect is 138 copies/mL. A negative result does not preclude SARS-Cov-2 infection and should not be used as the sole  basis for treatment or other patient management decisions. A negative result may occur with  improper specimen collection/handling, submission of specimen other than nasopharyngeal swab, presence of viral mutation(s) within the areas targeted by this assay, and inadequate number of viral copies(<138 copies/mL). A negative result must be combined with clinical observations, patient history, and epidemiological information. The expected result is Negative.  Fact Sheet for Patients:  EntrepreneurPulse.com.au  Fact Sheet for Healthcare Providers:  IncredibleEmployment.be  This test is no t yet approved or cleared by the [place] FDA and  has been authorized for detection and/or diagnosis of SARS-CoV-2 by FDA under an Emergency Use Authorization (EUA). This EUA will remain  in effect (meaning this test can be used) for the duration of the COVID-19 declaration under Section 564(b)(1) of the Act, 21 U.S.C.section 360bbb-3(b)(1), unless the authorization is terminated  or revoked sooner.  Influenza A by PCR NEGATIVE NEGATIVE Final   Influenza B by PCR NEGATIVE NEGATIVE Final    Comment: (NOTE) The Xpert Xpress SARS-CoV-2/FLU/RSV plus assay is intended as an aid in the diagnosis of influenza from Nasopharyngeal swab specimens and should not be used as a sole basis for treatment. Nasal washings and aspirates are unacceptable for Xpert Xpress SARS-CoV-2/FLU/RSV testing.  Fact Sheet for Patients: EntrepreneurPulse.com.au  Fact Sheet for Healthcare Providers: IncredibleEmployment.be  This test is not yet approved or cleared by the [place] FDA and has been authorized for detection and/or diagnosis of SARS-CoV-2 by FDA under an Emergency Use Authorization (EUA). This EUA will remain in effect (meaning this test can be used) for the duration of the COVID-19 declaration under Section 564(b)(1) of the Act,  21 U.S.C. section 360bbb-3(b)(1), unless the authorization is terminated or revoked.  Performed at [facility], [place] [address] [place], [place] [address]   Resp Panel by RT-PCR (Flu A&B, Covid) Nasopharyngeal Swab     Status: None   Collection Time: 02/04/21  7:01 AM   Specimen: Nasopharyngeal Swab; Nasopharyngeal(NP) swabs in vial transport medium  Result Value Ref Range Status   SARS Coronavirus 2 by RT PCR NEGATIVE NEGATIVE Final    Comment: (NOTE) SARS-CoV-2 target nucleic acids are NOT DETECTED.  The SARS-CoV-2 RNA is generally detectable in upper respiratory specimens during the acute phase of infection. The lowest concentration of SARS-CoV-2 viral copies this assay can detect is 138 copies/mL. A negative result does not preclude SARS-Cov-2 infection and should not be used as the sole basis for treatment or other patient management decisions. A negative result may occur with  improper specimen collection/handling, submission of specimen other than nasopharyngeal swab, presence of viral mutation(s) within the areas targeted by this assay, and inadequate number of viral copies(<138 copies/mL). A negative result must be combined with clinical observations, patient history, and epidemiological information. The expected result is Negative.  Fact Sheet for Patients:  EntrepreneurPulse.com.au  Fact Sheet for Healthcare Providers:  IncredibleEmployment.be  This test is no t yet approved or cleared by the [place] FDA and  has been authorized for detection and/or diagnosis of SARS-CoV-2 by FDA under an Emergency Use Authorization (EUA). This EUA will remain  in effect (meaning this test can be used) for the duration of the COVID-19 declaration under Section 564(b)(1) of the Act, 21 U.S.C.section 360bbb-3(b)(1), unless the authorization is terminated  or revoked sooner.       Influenza A by PCR NEGATIVE NEGATIVE  Final   Influenza B by PCR NEGATIVE NEGATIVE Final    Comment: (NOTE) The Xpert Xpress SARS-CoV-2/FLU/RSV plus assay is intended as an aid in the diagnosis of influenza from Nasopharyngeal swab specimens and should not be used as a sole basis for treatment. Nasal washings and aspirates are unacceptable for Xpert Xpress SARS-CoV-2/FLU/RSV testing.  Fact Sheet for Patients: EntrepreneurPulse.com.au  Fact Sheet for Healthcare Providers: IncredibleEmployment.be  This test is not yet approved or cleared by the [place] FDA and has been authorized for detection and/or diagnosis of SARS-CoV-2 by FDA under an Emergency Use Authorization (EUA). This EUA will remain in effect (meaning this test can be used) for the duration of the COVID-19 declaration under Section 564(b)(1) of the Act, 21 U.S.C. section 360bbb-3(b)(1), unless the authorization is terminated or revoked.  Performed at [facility], [place] [address] [place], [place] [address]          Radiology Studies: DG Chest 2 View  Result Date: 02/04/2021 CLINICAL DATA:  Shortness of breath EXAM: CHEST - 2 VIEW COMPARISON:  03/28/2017 FINDINGS: Normal heart size and mediastinal contours. No acute infiltrate or edema. No effusion or pneumothorax. No acute osseous findings. Artifact from EKG leads. IMPRESSION: Negative chest. Electronically Signed   By: Monte Fantasia M.D.   On: 02/04/2021 05:57   MR Lumbar Spine W Wo Contrast  Result Date: 02/04/2021 CLINICAL DATA:  Low back pain, febrile EXAM: MRI LUMBAR SPINE WITHOUT AND WITH CONTRAST TECHNIQUE: Multiplanar and multiecho pulse sequences of the lumbar spine were obtained without and with intravenous contrast. CONTRAST:  7.66mL GADAVIST GADOBUTROL 1 MMOL/ML IV SOLN COMPARISON:  2015 FINDINGS: Segmentation:  Standard. Alignment:  Anteroposterior alignment is maintained. Vertebrae: Vertebral body heights are preserved. Vertebral body  hemangioma is partially imaged at T11. Smaller hemangioma at L4. No marrow edema. Conus medullaris and cauda equina: Conus extends to the L1-L2 level. Conus and cauda equina appear normal. No abnormal intrathecal enhancement. Paraspinal and other soft tissues: Unremarkable. Disc levels: L1-L2:  Minimal disc bulge.  No canal or foraminal stenosis. L2-L3: Minimal disc bulge. Punctate left foraminal annular fissure. No canal or foraminal stenosis. L3-L4: Minimal disc bulge with small left foraminal protrusion. No canal or right foraminal stenosis. Minor left foraminal stenosis. L4-L5: Minimal disc bulge with small right annular fissure. No canal or foraminal stenosis. L5-S1: Disc bulge with endplate osteophytic ridging eccentric to the left. No canal stenosis. Slight effacement of the left subarticular recess. Left greater than right foraminal narrowing with disc/osteophyte contacting the exiting nerve roots bilaterally. IMPRESSION: No evidence of discitis/osteomyelitis. Mild degenerative changes as detailed above without substantial progression since 2015. Electronically Signed   By: Macy Mis M.D.   On: 02/04/2021 11:20   CT ABDOMEN PELVIS W CONTRAST  Result Date: 02/04/2021 CLINICAL DATA:  Abdominal pain, fever EXAM: CT ABDOMEN AND PELVIS WITH CONTRAST TECHNIQUE: Multidetector CT imaging of the abdomen and pelvis was performed using the standard protocol following bolus administration of intravenous contrast. CONTRAST:  167mL OMNIPAQUE IOHEXOL 300 MG/ML  SOLN COMPARISON:  02/02/2021 FINDINGS: Lower chest: No acute abnormality. Hepatobiliary: No solid liver abnormality is seen. No gallstones, gallbladder wall thickening, or biliary dilatation. Pancreas: Unremarkable. No pancreatic ductal dilatation or surrounding inflammatory changes. Spleen: Normal in size without significant abnormality. Adrenals/Urinary Tract: Adrenal glands are unremarkable. Tiny nonobstructive calculus of the inferior pole of the left  kidney (series 8, image 39). No other urinary tract calculi. No hydronephrosis. Bladder is unremarkable. Stomach/Bowel: Stomach is within normal limits. Appendix appears normal. No evidence of bowel wall thickening, distention, or inflammatory changes. Vascular/Lymphatic: No significant vascular findings are present. No enlarged abdominal or pelvic lymph nodes. Reproductive: No mass or other significant abnormality. Other: No abdominal wall hernia or abnormality. No abdominopelvic ascites. Musculoskeletal: No acute or significant osseous findings. IMPRESSION: 1. No acute CT findings of the abdomen or pelvis to explain abdominal pain. 2. Tiny nonobstructive calculus of the inferior pole of the left kidney. Electronically Signed   By: Eddie Candle M.D.   On: 02/04/2021 12:15        Scheduled Meds: . buPROPion  150 mg Oral q morning  . calcium carbonate  1 tablet Oral Daily  . enoxaparin (LOVENOX) injection  40 mg Subcutaneous Q24H  . escitalopram  10 mg Oral Daily  . famotidine  20 mg Oral BID  . insulin aspart  0-15 Units Subcutaneous TID WC  . QUEtiapine  25 mg Oral QHS   Continuous Infusions: . 0.9 %  NaCl with KCl 20 mEq / L 125 mL/hr at 02/05/21 0315          Aline August, MD [facility] 02/05/2021, 9:34 AM

## 2021-02-05 NOTE — Progress Notes (Signed)
MD notified pt BP 90/56

## 2021-02-05 NOTE — Plan of Care (Signed)

## 2021-02-06 DIAGNOSIS — R7989 Other specified abnormal findings of blood chemistry: Secondary | ICD-10-CM

## 2021-02-06 DIAGNOSIS — I959 Hypotension, unspecified: Secondary | ICD-10-CM | POA: Diagnosis not present

## 2021-02-06 DIAGNOSIS — K529 Noninfective gastroenteritis and colitis, unspecified: Secondary | ICD-10-CM | POA: Diagnosis not present

## 2021-02-06 LAB — COMPREHENSIVE METABOLIC PANEL
ALT: 325 U/L — ABNORMAL HIGH (ref 0–44)
AST: 132 U/L — ABNORMAL HIGH (ref 15–41)
Albumin: 2.8 g/dL — ABNORMAL LOW (ref 3.5–5.0)
Alkaline Phosphatase: 64 U/L (ref 38–126)
Anion gap: 7 (ref 5–15)
BUN: 11 mg/dL (ref 8–23)
CO2: 24 mmol/L (ref 22–32)
Calcium: 8.1 mg/dL — ABNORMAL LOW (ref 8.9–10.3)
Chloride: 108 mmol/L (ref 98–111)
Creatinine, Ser: 0.71 mg/dL (ref 0.44–1.00)
GFR, Estimated: >60 mL/min (ref >60)
Glucose, Bld: 106 mg/dL — ABNORMAL HIGH (ref 70–99)
Potassium: 3.5 mmol/L (ref 3.5–5.1)
Sodium: 139 mmol/L (ref 135–145)
Total Bilirubin: 0.7 mg/dL (ref 0.3–1.2)
Total Protein: 5.3 g/dL — ABNORMAL LOW (ref 6.5–8.1)

## 2021-02-06 LAB — CBC WITH DIFFERENTIAL/PLATELET
Abs Immature Granulocytes: 0.07 10*3/uL (ref 0.00–0.07)
Basophils Absolute: 0 10*3/uL (ref 0.0–0.1)
Basophils Relative: 0 %
Eosinophils Absolute: 0.2 10*3/uL (ref 0.0–0.5)
Eosinophils Relative: 2 %
HCT: 37.1 % (ref 36.0–46.0)
Hemoglobin: 12.7 g/dL (ref 12.0–15.0)
Immature Granulocytes: 1 %
Lymphocytes Relative: 17 %
Lymphs Abs: 1.9 10*3/uL (ref 0.7–4.0)
MCH: 30.9 pg (ref 26.0–34.0)
MCHC: 34.2 g/dL (ref 30.0–36.0)
MCV: 90.3 fL (ref 80.0–100.0)
Monocytes Absolute: 1.3 10*3/uL — ABNORMAL HIGH (ref 0.1–1.0)
Monocytes Relative: 12 %
Neutro Abs: 7.5 10*3/uL (ref 1.7–7.7)
Neutrophils Relative %: 68 %
Platelets: 123 10*3/uL — ABNORMAL LOW (ref 150–400)
RBC: 4.11 MIL/uL (ref 3.87–5.11)
RDW: 14 % (ref 11.5–15.5)
WBC: 10.9 10*3/uL — ABNORMAL HIGH (ref 4.0–10.5)
nRBC: 0 % (ref 0.0–0.2)

## 2021-02-06 LAB — MAGNESIUM: Magnesium: 1.8 mg/dL (ref 1.7–2.4)

## 2021-02-06 LAB — GLUCOSE, CAPILLARY: Glucose-Capillary: 97 mg/dL (ref 70–99)

## 2021-02-06 MED ORDER — TRAMADOL HCL 50 MG PO TABS: 50.0000 mg | ORAL_TABLET | Freq: Four times a day (QID) | ORAL | 0 refills | Status: AC | PRN

## 2021-02-06 MED ORDER — FAMOTIDINE 20 MG PO TABS: 20.0000 mg | ORAL_TABLET | Freq: Every day | ORAL | Status: AC | PRN

## 2021-02-06 NOTE — Discharge Summary (Addendum)
Physician Discharge Summary  Debra Morrison ZJQ:[id number] DOB: 08/11/1956 DOA: 02/04/2021  PCP: Jonathon Jordan, MD  Admit date: 02/04/2021 Discharge date: 02/06/2021  Admitted From: Home Disposition: Home  Recommendations for Outpatient Follow-up:  1. Follow up with PCP in 1 week with repeat CBC/CMP 2. Follow up in ED if symptoms worsen or new appear   Home Health: No Equipment/Devices: None  Discharge Condition: Stable CODE STATUS: Full Diet recommendation: Heart healthy/carb modified  Brief/Interim Summary: 65 y.o.femalewith medical history significant ofIIDM, HTN, HLD,anxiety/depression, presented with 3 days of nauseous vomiting diarrhea and fever.  Presentation, she had F temperature of 102.6 with blood pressure on the lower side and WBC of 9.8 along with elevated AST and ALT.  CT of the abdomen with contrast showed no significant etiology.  She was treated with IV fluids.  During the hospitalizing, her condition has improved.  Stool for C. difficile was negative.  She is tolerating diet.  She is hemodynamically stable.  She will be discharged home today.  Discharge Diagnoses:   Acute gastroenteritis presenting with nausea, vomiting, diarrhea and fever -Probable viral in origin.  CT of the abdomen showed no acute abnormality  -Treated with IV fluids.  Symptoms improving. During the hospitalizing, her condition has improved.  Stool for C. difficile was negative.  She is tolerating diet.  She is hemodynamically stable.  She will be discharged home today.  Hypotension -Possibly secondary to above.  Blood pressure improved with IV fluids.  Non-anion gap metabolic acidosis -Probably from diarrhea.  Improved.   Hyponatremia  -Improved  Hypokalemia--improved.  Mild hypocalcemia -Corrected calcium at the lower limit of normal. Continue supplementation  Fever/SIRS -No fever since presentation.  No signs of sepsis.  Leukocytosis -Possibly from above.     Resolved  Thrombocytopenia  -Possibly from above.  No signs of bleeding.  Outpatient follow-up  Elevated LFTs -Possibly from hypotension.  LFTs improving.  Monitor.  Hepatitis panel negative.    Right upper quadrant ultrasound was negative for gallstones  Anxiety/depression -Continue home regimen  Discharge Instructions  Discharge Instructions    Diet - low sodium heart healthy   Complete by: As directed    Diet Carb Modified   Complete by: As directed    Increase activity slowly   Complete by: As directed      Allergies as of 02/06/2021      Reactions   Boniva [ibandronic Acid] Other (See Comments)   Back pain   Dapagliflozin Other (See Comments)   Hydrocodone-acetaminophen    Other reaction(s): itch   Hydrocodone-acetaminophen Itching, Other (See Comments)   Other reaction(s): itch   Lexapro [escitalopram Oxalate]    GI upset   Metformin    Other reaction(s): headaches, dizziness, nausea      Medication List    STOP taking these medications   atorvastatin 40 MG tablet Commonly known as: LIPITOR     TAKE these medications   buPROPion 150 MG 24 hr tablet Commonly known as: WELLBUTRIN XL Take 150 mg by mouth every morning.   calcium carbonate 600 MG tablet Commonly known as: OS-CAL Take 600 mg by mouth daily.   cholecalciferol 1000 units tablet Commonly known as: VITAMIN D Take 1,000 Units by mouth daily.   dicyclomine 20 MG tablet Commonly known as: BENTYL Take 1 tablet (20 mg total) by mouth 2 (two) times daily as needed for spasms.   escitalopram 10 MG tablet Commonly known as: LEXAPRO Take 10 mg by mouth daily.   famotidine 20 MG tablet  Commonly known as: PEPCID Take 1 tablet (20 mg total) by mouth daily as needed for heartburn or indigestion.   multivitamin with minerals Tabs tablet Take 1 tablet by mouth daily.   Omega-3 1000 MG Caps Take 1,000 mg by mouth daily.   ondansetron 4 MG disintegrating tablet Commonly known as: Zofran  ODT Take 1 tablet (4 mg total) by mouth every 8 (eight) hours as needed for nausea or vomiting.   Procto-Med HC 2.5 % rectal cream Generic drug: hydrocortisone Place 1 application rectally daily as needed for pain or itching.   QUEtiapine 25 MG tablet Commonly known as: SEROQUEL Take 25 mg by mouth at bedtime.   traMADol 50 MG tablet Commonly known as: Ultram Take 1 tablet (50 mg total) by mouth every 6 (six) hours as needed for severe pain.   Trulicity 0.96 EA/5.4UJ Sopn Generic drug: Dulaglutide Inject 0.75 mg into the skin once a week.        Follow-up Information    Jonathon Jordan, MD. Schedule an appointment as soon as possible for a visit in 1 week(s).   Specialty: Family Medicine Why: with repeat cbc/cmp Contact information: [address] [place] [address] [phone]              Allergies  Allergen Reactions  . Boniva [Ibandronic Acid] Other (See Comments)    Back pain  . Dapagliflozin Other (See Comments)  . Hydrocodone-Acetaminophen     Other reaction(s): itch  . Hydrocodone-Acetaminophen Itching and Other (See Comments)    Other reaction(s): itch   . Lexapro [Escitalopram Oxalate]     GI upset  . Metformin     Other reaction(s): headaches, dizziness, nausea    Consultations:  None   Procedures/Studies: CT ABDOMEN PELVIS WO CONTRAST  Result Date: 02/02/2021 CLINICAL DATA:  Acute onset of abdominal pain and nausea and vomiting this morning. EXAM: CT ABDOMEN AND PELVIS WITHOUT CONTRAST TECHNIQUE: Multidetector CT imaging of the abdomen and pelvis was performed following the standard protocol without IV contrast. COMPARISON:  None. FINDINGS: Lower chest: No acute findings. Hepatobiliary: No mass visualized on this unenhanced exam. Gallbladder is unremarkable. No evidence of biliary ductal dilatation. Pancreas: No mass or inflammatory process visualized on this unenhanced exam. Spleen:  Within normal limits in size.  Adrenals/Urinary tract: A 2 mm calculus is noted in the lower pole of the left kidney. No evidence of ureteral calculi or hydronephrosis. A sub-cm fat attenuation lesion is also seen in the posterior interpolar region of the left kidney, consistent with a tiny benign angiomyolipoma. Stomach/Bowel: No evidence of obstruction, inflammatory process, or abnormal fluid collections. Normal appendix visualized. Vascular/Lymphatic: No pathologically enlarged lymph nodes identified. No evidence of abdominal aortic aneurysm. Reproductive:  No mass or other significant abnormality. Other:  None. Musculoskeletal:  No suspicious bone lesions identified. IMPRESSION: Tiny left renal calculus. No evidence of ureteral calculi, hydronephrosis, or other acute findings. Tiny benign left renal angiomyolipoma. Electronically Signed   By: Marlaine Hind M.D.   On: 02/02/2021 11:40   DG Chest 2 View  Result Date: 02/04/2021 CLINICAL DATA:  Shortness of breath EXAM: CHEST - 2 VIEW COMPARISON:  03/28/2017 FINDINGS: Normal heart size and mediastinal contours. No acute infiltrate or edema. No effusion or pneumothorax. No acute osseous findings. Artifact from EKG leads. IMPRESSION: Negative chest. Electronically Signed   By: Monte Fantasia M.D.   On: 02/04/2021 05:57   MR Lumbar Spine W Wo Contrast  Result Date: 02/04/2021 CLINICAL DATA:  Low  back pain, febrile EXAM: MRI LUMBAR SPINE WITHOUT AND WITH CONTRAST TECHNIQUE: Multiplanar and multiecho pulse sequences of the lumbar spine were obtained without and with intravenous contrast. CONTRAST:  7.43mL GADAVIST GADOBUTROL 1 MMOL/ML IV SOLN COMPARISON:  2015 FINDINGS: Segmentation:  Standard. Alignment:  Anteroposterior alignment is maintained. Vertebrae: Vertebral body heights are preserved. Vertebral body hemangioma is partially imaged at T11. Smaller hemangioma at L4. No marrow edema. Conus medullaris and cauda equina: Conus extends to the L1-L2 level. Conus and cauda equina appear  normal. No abnormal intrathecal enhancement. Paraspinal and other soft tissues: Unremarkable. Disc levels: L1-L2:  Minimal disc bulge.  No canal or foraminal stenosis. L2-L3: Minimal disc bulge. Punctate left foraminal annular fissure. No canal or foraminal stenosis. L3-L4: Minimal disc bulge with small left foraminal protrusion. No canal or right foraminal stenosis. Minor left foraminal stenosis. L4-L5: Minimal disc bulge with small right annular fissure. No canal or foraminal stenosis. L5-S1: Disc bulge with endplate osteophytic ridging eccentric to the left. No canal stenosis. Slight effacement of the left subarticular recess. Left greater than right foraminal narrowing with disc/osteophyte contacting the exiting nerve roots bilaterally. IMPRESSION: No evidence of discitis/osteomyelitis. Mild degenerative changes as detailed above without substantial progression since 2015. Electronically Signed   By: Macy Mis M.D.   On: 02/04/2021 11:20   CT ABDOMEN PELVIS W CONTRAST  Result Date: 02/04/2021 CLINICAL DATA:  Abdominal pain, fever EXAM: CT ABDOMEN AND PELVIS WITH CONTRAST TECHNIQUE: Multidetector CT imaging of the abdomen and pelvis was performed using the standard protocol following bolus administration of intravenous contrast. CONTRAST:  153mL OMNIPAQUE IOHEXOL 300 MG/ML  SOLN COMPARISON:  02/02/2021 FINDINGS: Lower chest: No acute abnormality. Hepatobiliary: No solid liver abnormality is seen. No gallstones, gallbladder wall thickening, or biliary dilatation. Pancreas: Unremarkable. No pancreatic ductal dilatation or surrounding inflammatory changes. Spleen: Normal in size without significant abnormality. Adrenals/Urinary Tract: Adrenal glands are unremarkable. Tiny nonobstructive calculus of the inferior pole of the left kidney (series 8, image 39). No other urinary tract calculi. No hydronephrosis. Bladder is unremarkable. Stomach/Bowel: Stomach is within normal limits. Appendix appears normal. No  evidence of bowel wall thickening, distention, or inflammatory changes. Vascular/Lymphatic: No significant vascular findings are present. No enlarged abdominal or pelvic lymph nodes. Reproductive: No mass or other significant abnormality. Other: No abdominal wall hernia or abnormality. No abdominopelvic ascites. Musculoskeletal: No acute or significant osseous findings. IMPRESSION: 1. No acute CT findings of the abdomen or pelvis to explain abdominal pain. 2. Tiny nonobstructive calculus of the inferior pole of the left kidney. Electronically Signed   By: Eddie Candle M.D.   On: 02/04/2021 12:15   US Abdomen Limited RUQ (LIVER/GB)  Result Date: 02/05/2021 CLINICAL DATA:  Elevated LFTs EXAM: ULTRASOUND ABDOMEN LIMITED RIGHT UPPER QUADRANT COMPARISON:  CT abdomen/pelvis dated 02/04/2021 FINDINGS: Gallbladder: No gallstones, gallbladder wall thickening, or pericholecystic fluid. Negative sonographic Murphy's sign. Common bile duct: Diameter: 5 mm. Liver: At the upper limits of normal for parenchymal echogenicity. No focal hepatic lesion is seen. Portal vein is patent on color Doppler imaging with normal direction of blood flow towards the liver. Other: None. IMPRESSION: Negative right upper quadrant ultrasound. Electronically Signed   By: Julian Hy M.D.   On: 02/05/2021 11:00       Subjective: Patient seen and examined at bedside.  Feels that her abdominal pain and vomiting and diarrhea have much improved.  She is tolerating diet.  Complains of some bilateral shoulder weakness.  He is ready to go home today.  Discharge Exam: Vitals:   02/05/21 1947 02/06/21 0357  BP: 138/89 136/72  Pulse: 81 86  Resp: 16 20  Temp: 97.8 F (36.6 C) 98.8 F (37.1 C)  SpO2: 100% 94%    General: Pt is alert, awake, not in acute distress Cardiovascular: rate controlled, S1/S2 + Respiratory: bilateral decreased breath sounds at bases Abdominal: Soft, NT, ND, bowel sounds + Extremities: no edema, no  cyanosis    The results of significant diagnostics from this hospitalization (including imaging, microbiology, ancillary and laboratory) are listed below for reference.     Microbiology: Recent Results (from the past 240 hour(s))  Resp Panel by RT-PCR (Flu A&B, Covid) Nasopharyngeal Swab     Status: None   Collection Time: 02/02/21  8:33 AM   Specimen: Nasopharyngeal Swab; Nasopharyngeal(NP) swabs in vial transport medium  Result Value Ref Range Status   SARS Coronavirus 2 by RT PCR NEGATIVE NEGATIVE Final    Comment: (NOTE) SARS-CoV-2 target nucleic acids are NOT DETECTED.  The SARS-CoV-2 RNA is generally detectable in upper respiratory specimens during the acute phase of infection. The lowest concentration of SARS-CoV-2 viral copies this assay can detect is 138 copies/mL. A negative result does not preclude SARS-Cov-2 infection and should not be used as the sole basis for treatment or other patient management decisions. A negative result may occur with  improper specimen collection/handling, submission of specimen other than nasopharyngeal swab, presence of viral mutation(s) within the areas targeted by this assay, and inadequate number of viral copies(<138 copies/mL). A negative result must be combined with clinical observations, patient history, and epidemiological information. The expected result is Negative.  Fact Sheet for Patients:  EntrepreneurPulse.com.au  Fact Sheet for Healthcare Providers:  IncredibleEmployment.be  This test is no t yet approved or cleared by the [place] FDA and  has been authorized for detection and/or diagnosis of SARS-CoV-2 by FDA under an Emergency Use Authorization (EUA). This EUA will remain  in effect (meaning this test can be used) for the duration of the COVID-19 declaration under Section 564(b)(1) of the Act, 21 U.S.C.section 360bbb-3(b)(1), unless the authorization is terminated  or revoked  sooner.       Influenza A by PCR NEGATIVE NEGATIVE Final   Influenza B by PCR NEGATIVE NEGATIVE Final    Comment: (NOTE) The Xpert Xpress SARS-CoV-2/FLU/RSV plus assay is intended as an aid in the diagnosis of influenza from Nasopharyngeal swab specimens and should not be used as a sole basis for treatment. Nasal washings and aspirates are unacceptable for Xpert Xpress SARS-CoV-2/FLU/RSV testing.  Fact Sheet for Patients: EntrepreneurPulse.com.au  Fact Sheet for Healthcare Providers: IncredibleEmployment.be  This test is not yet approved or cleared by the [place] FDA and has been authorized for detection and/or diagnosis of SARS-CoV-2 by FDA under an Emergency Use Authorization (EUA). This EUA will remain in effect (meaning this test can be used) for the duration of the COVID-19 declaration under Section 564(b)(1) of the Act, 21 U.S.C. section 360bbb-3(b)(1), unless the authorization is terminated or revoked.  Performed at [facility], [place] [address] [place], [place] [address]   Resp Panel by RT-PCR (Flu A&B, Covid) Nasopharyngeal Swab     Status: None   Collection Time: 02/04/21  7:01 AM   Specimen: Nasopharyngeal Swab; Nasopharyngeal(NP) swabs in vial transport medium  Result Value Ref Range Status   SARS Coronavirus 2 by RT PCR NEGATIVE NEGATIVE Final    Comment: (NOTE) SARS-CoV-2 target nucleic acids are NOT DETECTED.  The SARS-CoV-2 RNA  is generally detectable in upper respiratory specimens during the acute phase of infection. The lowest concentration of SARS-CoV-2 viral copies this assay can detect is 138 copies/mL. A negative result does not preclude SARS-Cov-2 infection and should not be used as the sole basis for treatment or other patient management decisions. A negative result may occur with  improper specimen collection/handling, submission of specimen other than nasopharyngeal swab, presence of  viral mutation(s) within the areas targeted by this assay, and inadequate number of viral copies(<138 copies/mL). A negative result must be combined with clinical observations, patient history, and epidemiological information. The expected result is Negative.  Fact Sheet for Patients:  EntrepreneurPulse.com.au  Fact Sheet for Healthcare Providers:  IncredibleEmployment.be  This test is no t yet approved or cleared by the [place] FDA and  has been authorized for detection and/or diagnosis of SARS-CoV-2 by FDA under an Emergency Use Authorization (EUA). This EUA will remain  in effect (meaning this test can be used) for the duration of the COVID-19 declaration under Section 564(b)(1) of the Act, 21 U.S.C.section 360bbb-3(b)(1), unless the authorization is terminated  or revoked sooner.       Influenza A by PCR NEGATIVE NEGATIVE Final   Influenza B by PCR NEGATIVE NEGATIVE Final    Comment: (NOTE) The Xpert Xpress SARS-CoV-2/FLU/RSV plus assay is intended as an aid in the diagnosis of influenza from Nasopharyngeal swab specimens and should not be used as a sole basis for treatment. Nasal washings and aspirates are unacceptable for Xpert Xpress SARS-CoV-2/FLU/RSV testing.  Fact Sheet for Patients: EntrepreneurPulse.com.au  Fact Sheet for Healthcare Providers: IncredibleEmployment.be  This test is not yet approved or cleared by the [place] FDA and has been authorized for detection and/or diagnosis of SARS-CoV-2 by FDA under an Emergency Use Authorization (EUA). This EUA will remain in effect (meaning this test can be used) for the duration of the COVID-19 declaration under Section 564(b)(1) of the Act, 21 U.S.C. section 360bbb-3(b)(1), unless the authorization is terminated or revoked.  Performed at [facility], [place] [address], [place], [place] [address]   C Difficile Quick Screen w PCR  reflex     Status: None   Collection Time: 02/05/21  8:12 AM   Specimen: STOOL  Result Value Ref Range Status   C Diff antigen NEGATIVE NEGATIVE Final   C Diff toxin NEGATIVE NEGATIVE Final   C Diff interpretation No C. difficile detected.  Final    Comment: Performed at [facility], [place] [address] [place], [place] [address]     Labs: BNP (last 3 results) No results for input(s): BNP in the last 8760 hours. Basic Metabolic Panel: Recent Labs  Lab 02/02/21 0848 02/04/21 0530 02/05/21 0040 02/05/21 1627 02/06/21 0319  NA 139 131* 136 140 139  K 4.6 3.4* 3.3* 3.8 3.5  CL 105 103 104 111 108  CO2 24 19* 23 23 24   GLUCOSE 132* 168* 128* 112* 106*  BUN 21 14 17 14 11   CREATININE 0.93 0.90 1.11* 0.84 0.71  CALCIUM 9.4 8.3* 7.5* 8.1* 8.1*  MG  --   --   --  1.8 1.8   Liver Function Tests: Recent Labs  Lab 02/02/21 0848 02/04/21 0530 02/05/21 0040 02/05/21 1627 02/06/21 0319  AST 38 131* 452* 244* 132*  ALT 32 148* 561* 442* 325*  ALKPHOS 61 62 60 78 64  BILITOT 1.0 0.8 1.0 0.7 0.7  PROT 8.1 6.4* 5.2* 5.5* 5.3*  ALBUMIN 4.7 3.8 2.9* 3.0*  2.8*   Recent Labs  Lab 02/02/21 0848  LIPASE 36   No results for input(s): AMMONIA in the last 168 hours. CBC: Recent Labs  Lab 02/02/21 0848 02/04/21 0530 02/05/21 0040 02/06/21 0319  WBC 16.2* 9.6 12.7* 10.9*  NEUTROABS 14.3* 8.8* 10.9* 7.5  HGB 16.1* 14.5 13.1 12.7  HCT 48.2* 44.0 39.8 37.1  MCV 92.5 92.8 92.1 90.3  PLT 270 148* 120* 123*   Cardiac Enzymes: Recent Labs  Lab 02/04/21 1709  CKTOTAL 665*   BNP: Invalid input(s): POCBNP CBG: Recent Labs  Lab 02/05/21 0724 02/05/21 1143 02/05/21 1546 02/05/21 1959 02/06/21 0753  GLUCAP 73 134* 105* 97 97   D-Dimer No results for input(s): DDIMER in the last 72 hours. Hgb A1c Recent Labs    02/04/21 1709  HGBA1C 5.7*   Lipid Profile No results for input(s): CHOL, HDL, LDLCALC, TRIG, CHOLHDL, LDLDIRECT in the last 72 hours. Thyroid function  studies No results for input(s): TSH, T4TOTAL, T3FREE, THYROIDAB in the last 72 hours.  Invalid input(s): FREET3 Anemia work up No results for input(s): VITAMINB12, FOLATE, FERRITIN, TIBC, IRON, RETICCTPCT in the last 72 hours. Urinalysis    Component Value Date/Time   COLORURINE YELLOW 02/04/2021 0519   APPEARANCEUR CLEAR 02/04/2021 0519   LABSPEC 1.016 02/04/2021 0519   PHURINE 5.0 02/04/2021 0519   GLUCOSEU NEGATIVE 02/04/2021 0519   HGBUR NEGATIVE 02/04/2021 0519   HGBUR negative 01/19/2010 1432   BILIRUBINUR NEGATIVE 02/04/2021 0519   KETONESUR NEGATIVE 02/04/2021 0519   PROTEINUR NEGATIVE 02/04/2021 0519   UROBILINOGEN 0.2 01/04/2014 0857   NITRITE NEGATIVE 02/04/2021 0519   LEUKOCYTESUR NEGATIVE 02/04/2021 0519   Sepsis Labs Invalid input(s): PROCALCITONIN,  WBC,  LACTICIDVEN Microbiology Recent Results (from the past 240 hour(s))  Resp Panel by RT-PCR (Flu A&B, Covid) Nasopharyngeal Swab     Status: None   Collection Time: 02/02/21  8:33 AM   Specimen: Nasopharyngeal Swab; Nasopharyngeal(NP) swabs in vial transport medium  Result Value Ref Range Status   SARS Coronavirus 2 by RT PCR NEGATIVE NEGATIVE Final    Comment: (NOTE) SARS-CoV-2 target nucleic acids are NOT DETECTED.  The SARS-CoV-2 RNA is generally detectable in upper respiratory specimens during the acute phase of infection. The lowest concentration of SARS-CoV-2 viral copies this assay can detect is 138 copies/mL. A negative result does not preclude SARS-Cov-2 infection and should not be used as the sole basis for treatment or other patient management decisions. A negative result may occur with  improper specimen collection/handling, submission of specimen other than nasopharyngeal swab, presence of viral mutation(s) within the areas targeted by this assay, and inadequate number of viral copies(<138 copies/mL). A negative result must be combined with clinical observations, patient history, and  epidemiological information. The expected result is Negative.  Fact Sheet for Patients:  EntrepreneurPulse.com.au  Fact Sheet for Healthcare Providers:  IncredibleEmployment.be  This test is no t yet approved or cleared by the [place] FDA and  has been authorized for detection and/or diagnosis of SARS-CoV-2 by FDA under an Emergency Use Authorization (EUA). This EUA will remain  in effect (meaning this test can be used) for the duration of the COVID-19 declaration under Section 564(b)(1) of the Act, 21 U.S.C.section 360bbb-3(b)(1), unless the authorization is terminated  or revoked sooner.       Influenza A by PCR NEGATIVE NEGATIVE Final   Influenza B by PCR NEGATIVE NEGATIVE Final    Comment: (NOTE) The Xpert Xpress SARS-CoV-2/FLU/RSV plus assay is intended as an aid  in the diagnosis of influenza from Nasopharyngeal swab specimens and should not be used as a sole basis for treatment. Nasal washings and aspirates are unacceptable for Xpert Xpress SARS-CoV-2/FLU/RSV testing.  Fact Sheet for Patients: EntrepreneurPulse.com.au  Fact Sheet for Healthcare Providers: IncredibleEmployment.be  This test is not yet approved or cleared by the [place] FDA and has been authorized for detection and/or diagnosis of SARS-CoV-2 by FDA under an Emergency Use Authorization (EUA). This EUA will remain in effect (meaning this test can be used) for the duration of the COVID-19 declaration under Section 564(b)(1) of the Act, 21 U.S.C. section 360bbb-3(b)(1), unless the authorization is terminated or revoked.  Performed at [facility], [place] [address] [place], [place] [address]   Resp Panel by RT-PCR (Flu A&B, Covid) Nasopharyngeal Swab     Status: None   Collection Time: 02/04/21  7:01 AM   Specimen: Nasopharyngeal Swab; Nasopharyngeal(NP) swabs in vial transport medium  Result Value Ref  Range Status   SARS Coronavirus 2 by RT PCR NEGATIVE NEGATIVE Final    Comment: (NOTE) SARS-CoV-2 target nucleic acids are NOT DETECTED.  The SARS-CoV-2 RNA is generally detectable in upper respiratory specimens during the acute phase of infection. The lowest concentration of SARS-CoV-2 viral copies this assay can detect is 138 copies/mL. A negative result does not preclude SARS-Cov-2 infection and should not be used as the sole basis for treatment or other patient management decisions. A negative result may occur with  improper specimen collection/handling, submission of specimen other than nasopharyngeal swab, presence of viral mutation(s) within the areas targeted by this assay, and inadequate number of viral copies(<138 copies/mL). A negative result must be combined with clinical observations, patient history, and epidemiological information. The expected result is Negative.  Fact Sheet for Patients:  EntrepreneurPulse.com.au  Fact Sheet for Healthcare Providers:  IncredibleEmployment.be  This test is no t yet approved or cleared by the [place] FDA and  has been authorized for detection and/or diagnosis of SARS-CoV-2 by FDA under an Emergency Use Authorization (EUA). This EUA will remain  in effect (meaning this test can be used) for the duration of the COVID-19 declaration under Section 564(b)(1) of the Act, 21 U.S.C.section 360bbb-3(b)(1), unless the authorization is terminated  or revoked sooner.       Influenza A by PCR NEGATIVE NEGATIVE Final   Influenza B by PCR NEGATIVE NEGATIVE Final    Comment: (NOTE) The Xpert Xpress SARS-CoV-2/FLU/RSV plus assay is intended as an aid in the diagnosis of influenza from Nasopharyngeal swab specimens and should not be used as a sole basis for treatment. Nasal washings and aspirates are unacceptable for Xpert Xpress SARS-CoV-2/FLU/RSV testing.  Fact Sheet for  Patients: EntrepreneurPulse.com.au  Fact Sheet for Healthcare Providers: IncredibleEmployment.be  This test is not yet approved or cleared by the [place] FDA and has been authorized for detection and/or diagnosis of SARS-CoV-2 by FDA under an Emergency Use Authorization (EUA). This EUA will remain in effect (meaning this test can be used) for the duration of the COVID-19 declaration under Section 564(b)(1) of the Act, 21 U.S.C. section 360bbb-3(b)(1), unless the authorization is terminated or revoked.  Performed at [facility], [place] [address] [place], [place] [address]   C Difficile Quick Screen w PCR reflex     Status: None   Collection Time: 02/05/21  8:12 AM   Specimen: STOOL  Result Value Ref Range Status   C Diff antigen NEGATIVE NEGATIVE Final   C Diff toxin NEGATIVE  NEGATIVE Final   C Diff interpretation No C. difficile detected.  Final    Comment: Performed at [facility], [place] [address], [place], [place] [address]     Time coordinating discharge: 35 minutes  SIGNED:   Aline August, MD  [facility] 02/06/2021, 9:32 AM

## 2021-02-06 NOTE — Plan of Care (Signed)

## 2021-02-06 NOTE — Plan of Care (Signed)

## 2021-02-07 DIAGNOSIS — A084 Viral intestinal infection, unspecified: Secondary | ICD-10-CM | POA: Diagnosis not present

## 2021-02-07 LAB — GASTROINTESTINAL PANEL BY PCR, STOOL (REPLACES STOOL CULTURE)
Adenovirus F40/41: NOT DETECTED
Astrovirus: NOT DETECTED
Campylobacter species: NOT DETECTED
Cryptosporidium: NOT DETECTED
Cyclospora cayetanensis: NOT DETECTED
E. coli O157: NOT DETECTED
Entamoeba histolytica: NOT DETECTED
Enteroaggregative E coli (EAEC): NOT DETECTED
Enterotoxigenic E coli (ETEC): NOT DETECTED
Giardia lamblia: NOT DETECTED
Norovirus GI/GII: DETECTED — AB
Plesimonas shigelloides: NOT DETECTED
Rotavirus A: NOT DETECTED
Salmonella species: NOT DETECTED
Sapovirus (I, II, IV, and V): NOT DETECTED
Shiga like toxin producing E coli (STEC): DETECTED — AB
Shigella/Enteroinvasive E coli (EIEC): NOT DETECTED
Vibrio cholerae: NOT DETECTED
Vibrio species: NOT DETECTED
Yersinia enterocolitica: NOT DETECTED

## 2021-02-14 DIAGNOSIS — A084 Viral intestinal infection, unspecified: Secondary | ICD-10-CM | POA: Diagnosis not present

## 2021-03-22 DIAGNOSIS — E11319 Type 2 diabetes mellitus with unspecified diabetic retinopathy without macular edema: Secondary | ICD-10-CM | POA: Diagnosis not present

## 2021-03-24 DIAGNOSIS — L299 Pruritus, unspecified: Secondary | ICD-10-CM | POA: Diagnosis not present

## 2021-03-24 DIAGNOSIS — L282 Other prurigo: Secondary | ICD-10-CM | POA: Diagnosis not present

## 2021-04-13 DIAGNOSIS — M654 Radial styloid tenosynovitis [de Quervain]: Secondary | ICD-10-CM | POA: Diagnosis not present

## 2021-04-27 DIAGNOSIS — H40013 Open angle with borderline findings, low risk, bilateral: Secondary | ICD-10-CM | POA: Diagnosis not present

## 2021-05-03 DIAGNOSIS — K589 Irritable bowel syndrome without diarrhea: Secondary | ICD-10-CM | POA: Diagnosis not present

## 2021-05-03 DIAGNOSIS — Z794 Long term (current) use of insulin: Secondary | ICD-10-CM | POA: Diagnosis not present

## 2021-05-03 DIAGNOSIS — B351 Tinea unguium: Secondary | ICD-10-CM | POA: Diagnosis not present

## 2021-05-03 DIAGNOSIS — E1122 Type 2 diabetes mellitus with diabetic chronic kidney disease: Secondary | ICD-10-CM | POA: Diagnosis not present

## 2021-05-04 DIAGNOSIS — K219 Gastro-esophageal reflux disease without esophagitis: Secondary | ICD-10-CM | POA: Diagnosis not present

## 2021-05-04 DIAGNOSIS — R053 Chronic cough: Secondary | ICD-10-CM | POA: Diagnosis not present

## 2021-05-04 DIAGNOSIS — K1230 Oral mucositis (ulcerative), unspecified: Secondary | ICD-10-CM | POA: Diagnosis not present

## 2021-05-04 DIAGNOSIS — R0982 Postnasal drip: Secondary | ICD-10-CM | POA: Diagnosis not present

## 2021-06-29 DIAGNOSIS — I10 Essential (primary) hypertension: Secondary | ICD-10-CM | POA: Diagnosis not present

## 2021-06-29 DIAGNOSIS — E1169 Type 2 diabetes mellitus with other specified complication: Secondary | ICD-10-CM | POA: Diagnosis not present

## 2021-06-29 DIAGNOSIS — L298 Other pruritus: Secondary | ICD-10-CM | POA: Diagnosis not present

## 2021-06-29 DIAGNOSIS — R42 Dizziness and giddiness: Secondary | ICD-10-CM | POA: Diagnosis not present

## 2021-07-13 DIAGNOSIS — K219 Gastro-esophageal reflux disease without esophagitis: Secondary | ICD-10-CM | POA: Diagnosis not present

## 2021-07-13 DIAGNOSIS — R053 Chronic cough: Secondary | ICD-10-CM | POA: Diagnosis not present

## 2021-08-21 DIAGNOSIS — I270 Primary pulmonary hypertension: Secondary | ICD-10-CM | POA: Diagnosis not present

## 2021-08-21 DIAGNOSIS — N3000 Acute cystitis without hematuria: Secondary | ICD-10-CM | POA: Diagnosis not present

## 2021-08-21 DIAGNOSIS — I2510 Atherosclerotic heart disease of native coronary artery without angina pectoris: Secondary | ICD-10-CM | POA: Diagnosis not present

## 2021-08-21 DIAGNOSIS — G4700 Insomnia, unspecified: Secondary | ICD-10-CM | POA: Diagnosis not present

## 2021-08-21 DIAGNOSIS — Z794 Long term (current) use of insulin: Secondary | ICD-10-CM | POA: Diagnosis not present

## 2021-08-21 DIAGNOSIS — Z0000 Encounter for general adult medical examination without abnormal findings: Secondary | ICD-10-CM | POA: Diagnosis not present

## 2021-08-21 DIAGNOSIS — I10 Essential (primary) hypertension: Secondary | ICD-10-CM | POA: Diagnosis not present

## 2021-08-21 DIAGNOSIS — E1342 Other specified diabetes mellitus with diabetic polyneuropathy: Secondary | ICD-10-CM | POA: Diagnosis not present

## 2021-08-21 DIAGNOSIS — E1169 Type 2 diabetes mellitus with other specified complication: Secondary | ICD-10-CM | POA: Diagnosis not present

## 2021-08-21 DIAGNOSIS — E7800 Pure hypercholesterolemia, unspecified: Secondary | ICD-10-CM | POA: Diagnosis not present

## 2021-08-21 DIAGNOSIS — E559 Vitamin D deficiency, unspecified: Secondary | ICD-10-CM | POA: Diagnosis not present

## 2021-08-21 DIAGNOSIS — Z79899 Other long term (current) drug therapy: Secondary | ICD-10-CM | POA: Diagnosis not present

## 2021-09-16 IMAGING — CT CT ABD-PELV W/O CM
2 of 4 series · 17 of 46 positions shown, 19 images · non-contrast
Comparison: None.

CLINICAL DATA: Acute onset of abdominal pain and nausea and
vomiting this morning.

EXAM:
CT ABDOMEN AND PELVIS WITHOUT CONTRAST
TECHNIQUE: Multidetector CT imaging of the abdomen and pelvis was performed
following the standard protocol without IV contrast.

[Series 2: axial st · axial · 0.76mm/px · z∈[-583,-183]mm · 14 of 92 slices shown, 16 images]
[im 6/92  soft-tissue]
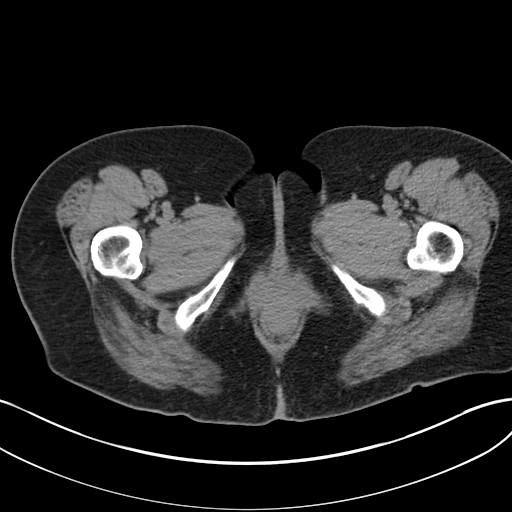
[im 6/92  bone]
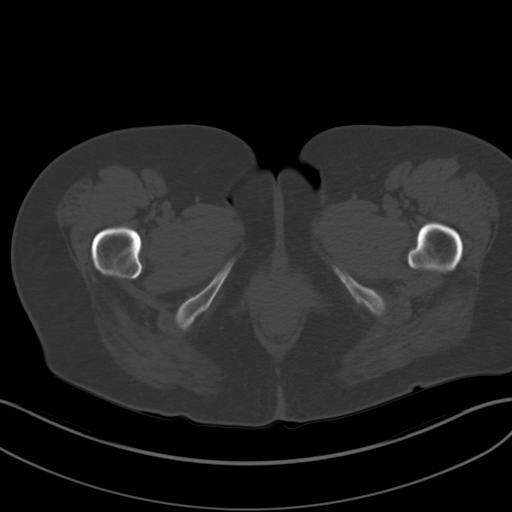
[im 11/92  soft-tissue]
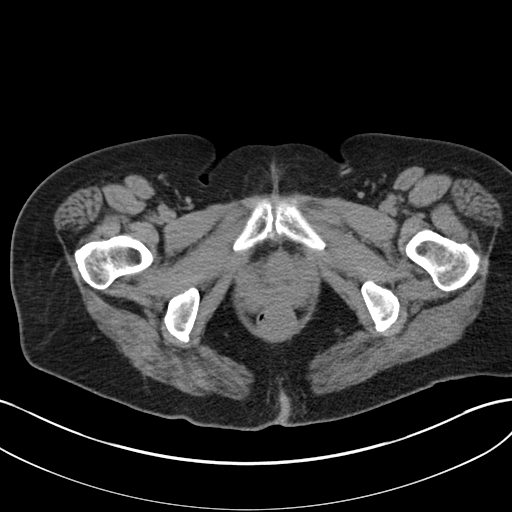
[im 21/92  soft-tissue]
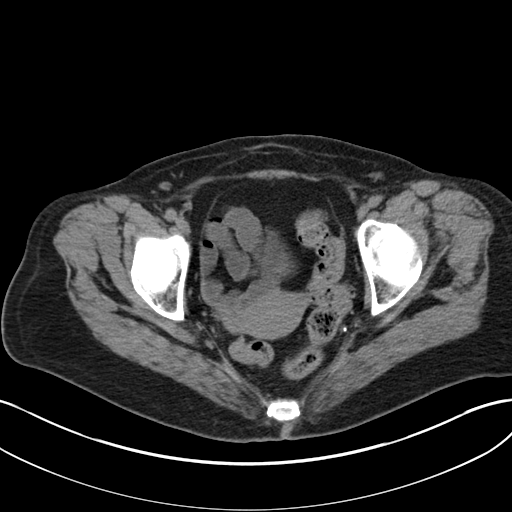
[im 26/92  soft-tissue]
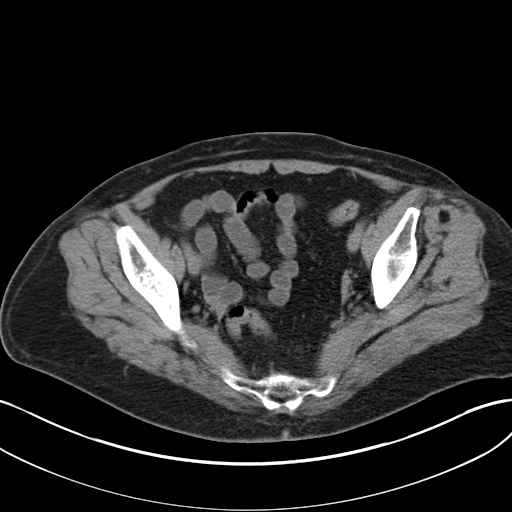
[im 31/92  soft-tissue]
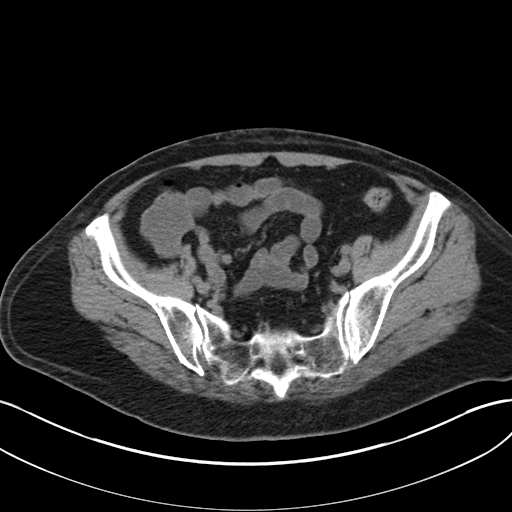
[im 36/92  soft-tissue]
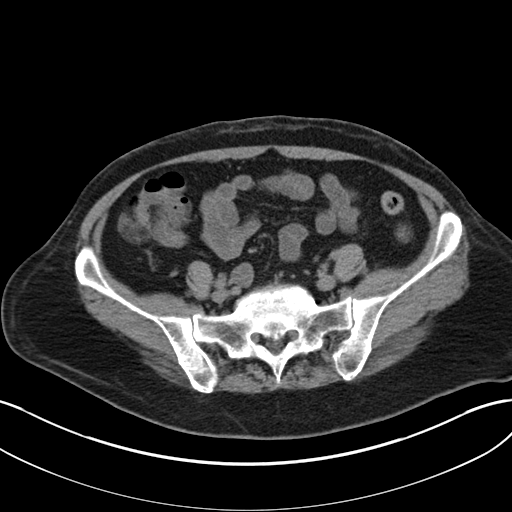
[im 41/92  soft-tissue]
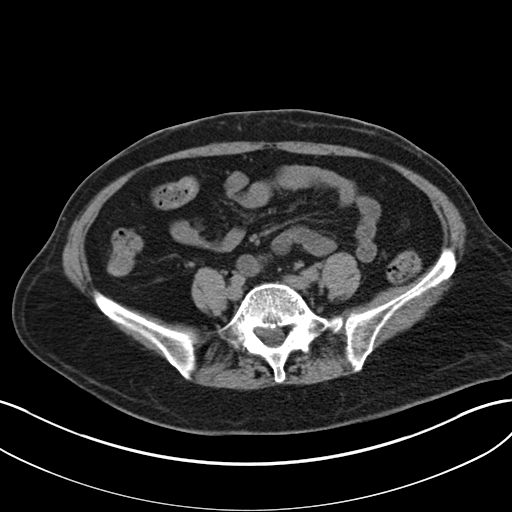
[im 51/92  soft-tissue]
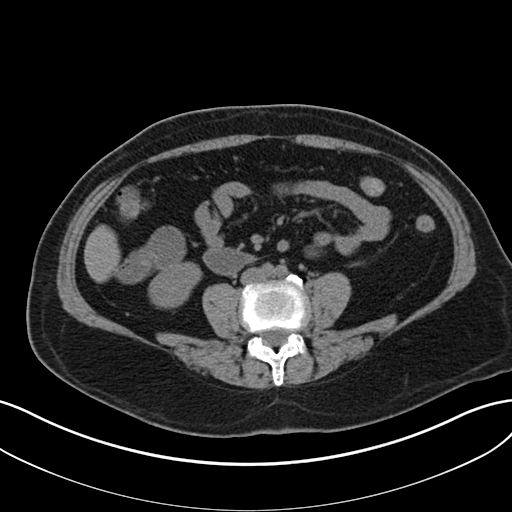
[im 56/92  soft-tissue]
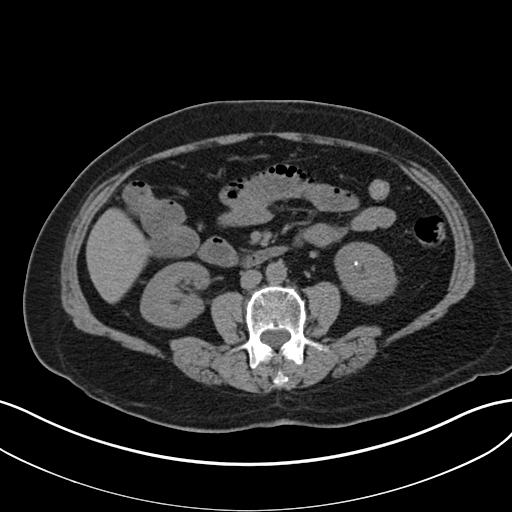
[im 56/92  bone]
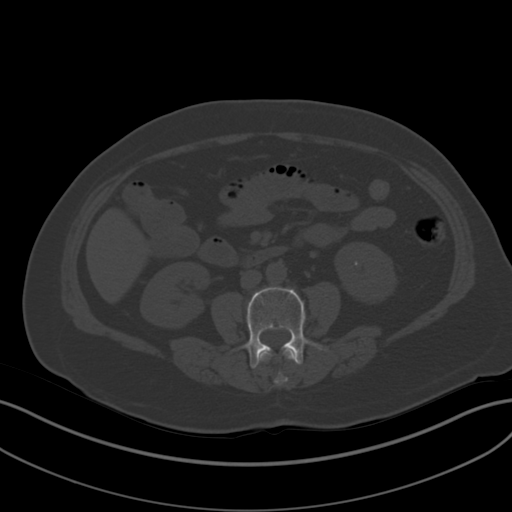
[im 61/92  soft-tissue]
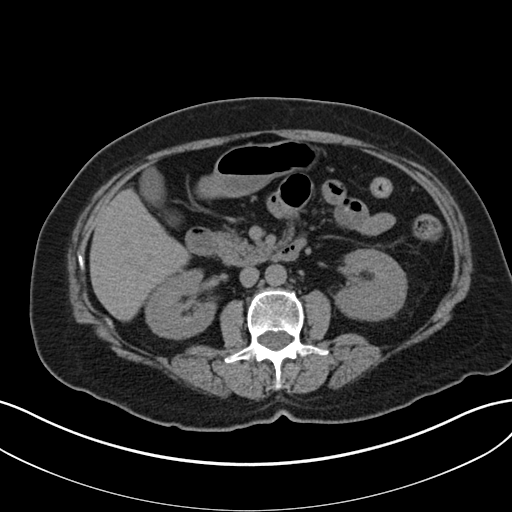
[im 66/92  soft-tissue]
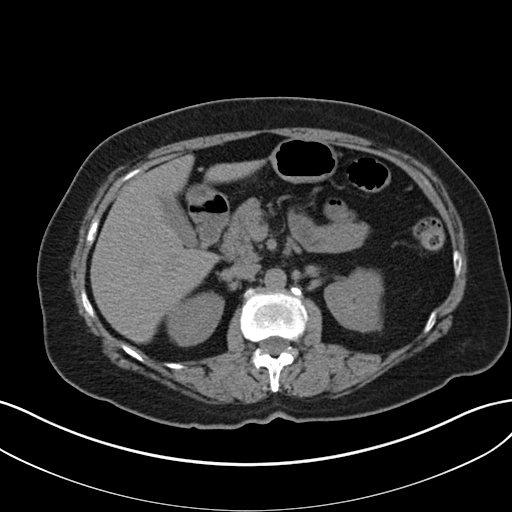
[im 71/92  soft-tissue]
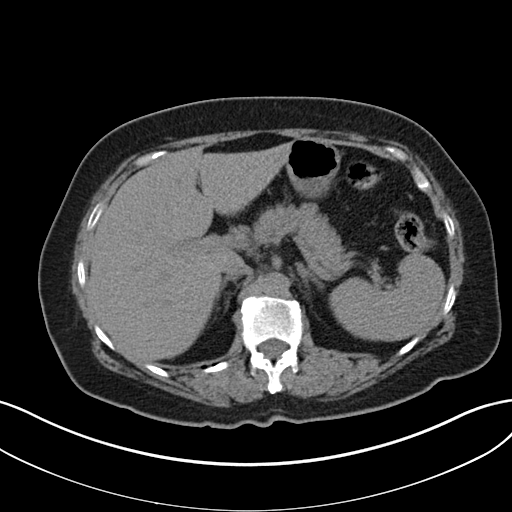
[im 81/92  soft-tissue]
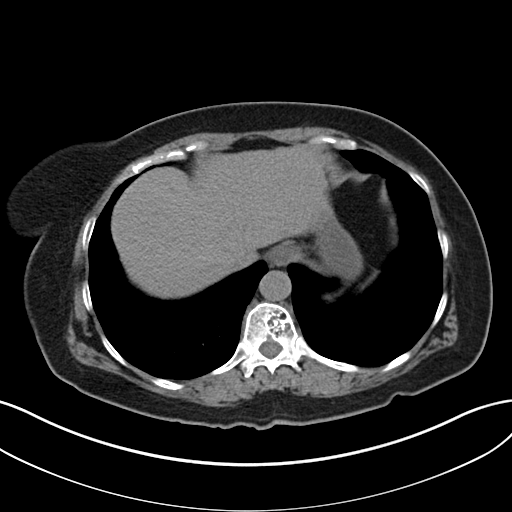
[im 86/92  soft-tissue]
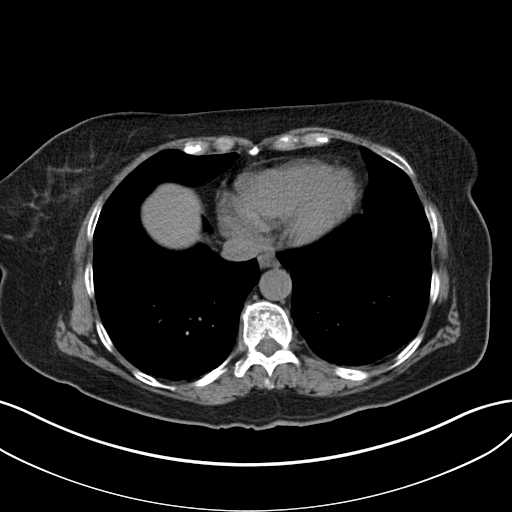

[Series 4: coronal st · coronal · 0.86mm/px · 3 of 143 slices shown]
[im 48/143  soft-tissue]
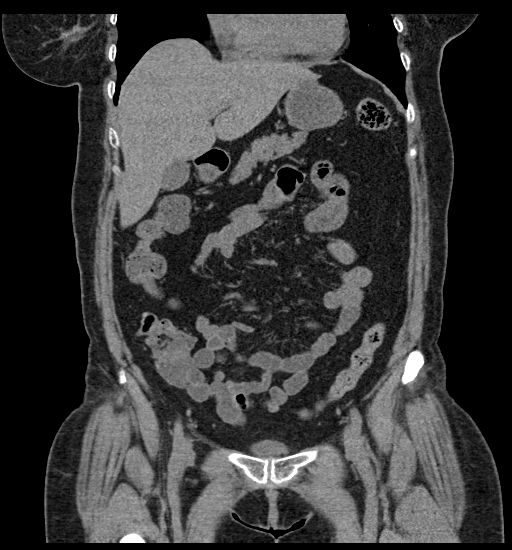
[im 64/143  soft-tissue]
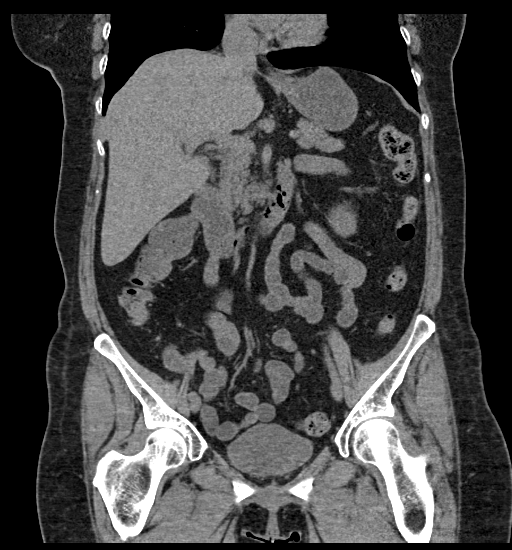
[im 79/143  soft-tissue]
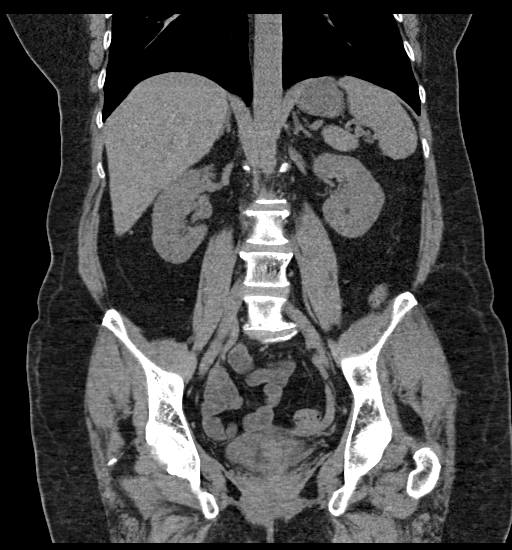

[17 of 46 positions shown; findings below may reference images not displayed]

FINDINGS: Lower chest: No acute findings.

Hepatobiliary: No mass visualized on this unenhanced exam.
Gallbladder is unremarkable. No evidence of biliary ductal
dilatation.

Pancreas: No mass or inflammatory process visualized on this
unenhanced exam.

Spleen:  Within normal limits in size.

Adrenals/Urinary tract: A 2 mm calculus is noted in the lower pole
of the left kidney. No evidence of ureteral calculi or
hydronephrosis. A sub-cm fat attenuation lesion is also seen in the
posterior interpolar region of the left kidney, consistent with a
tiny benign angiomyolipoma.

Stomach/Bowel: No evidence of obstruction, inflammatory process, or
abnormal fluid collections. Normal appendix visualized.

Vascular/Lymphatic: No pathologically enlarged lymph nodes
identified. No evidence of abdominal aortic aneurysm.

Reproductive:  No mass or other significant abnormality.

Other:  None.

Musculoskeletal:  No suspicious bone lesions identified.
IMPRESSION: Tiny left renal calculus. No evidence of ureteral calculi,
hydronephrosis, or other acute findings.

Tiny benign left renal angiomyolipoma.

## 2021-09-18 IMAGING — DX DG CHEST 2V
2 series · 2 of 2 positions shown · non-contrast
Comparison: 03/28/2017

CLINICAL DATA: Shortness of breath

EXAM:
CHEST - 2 VIEW

[chest pa]
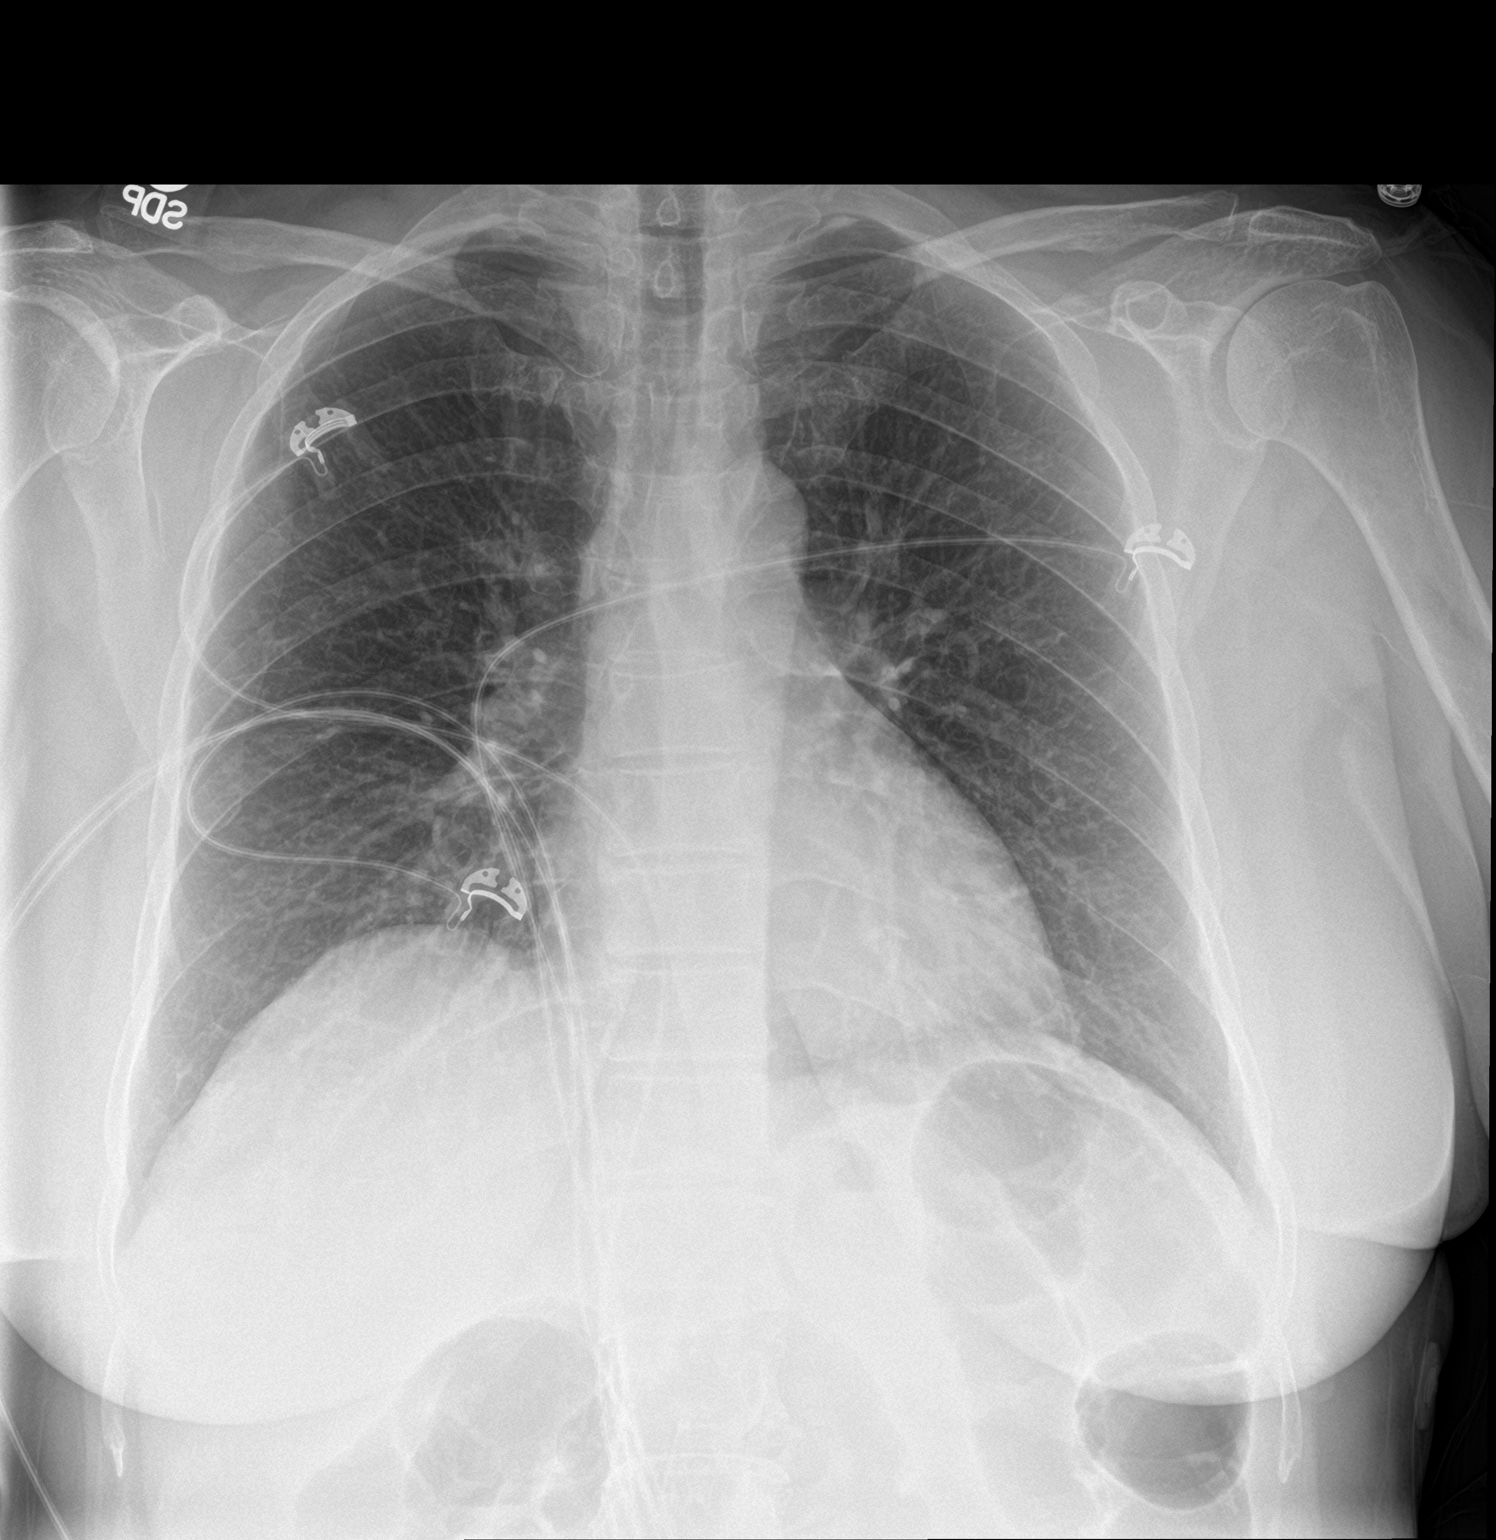

[chest lat]
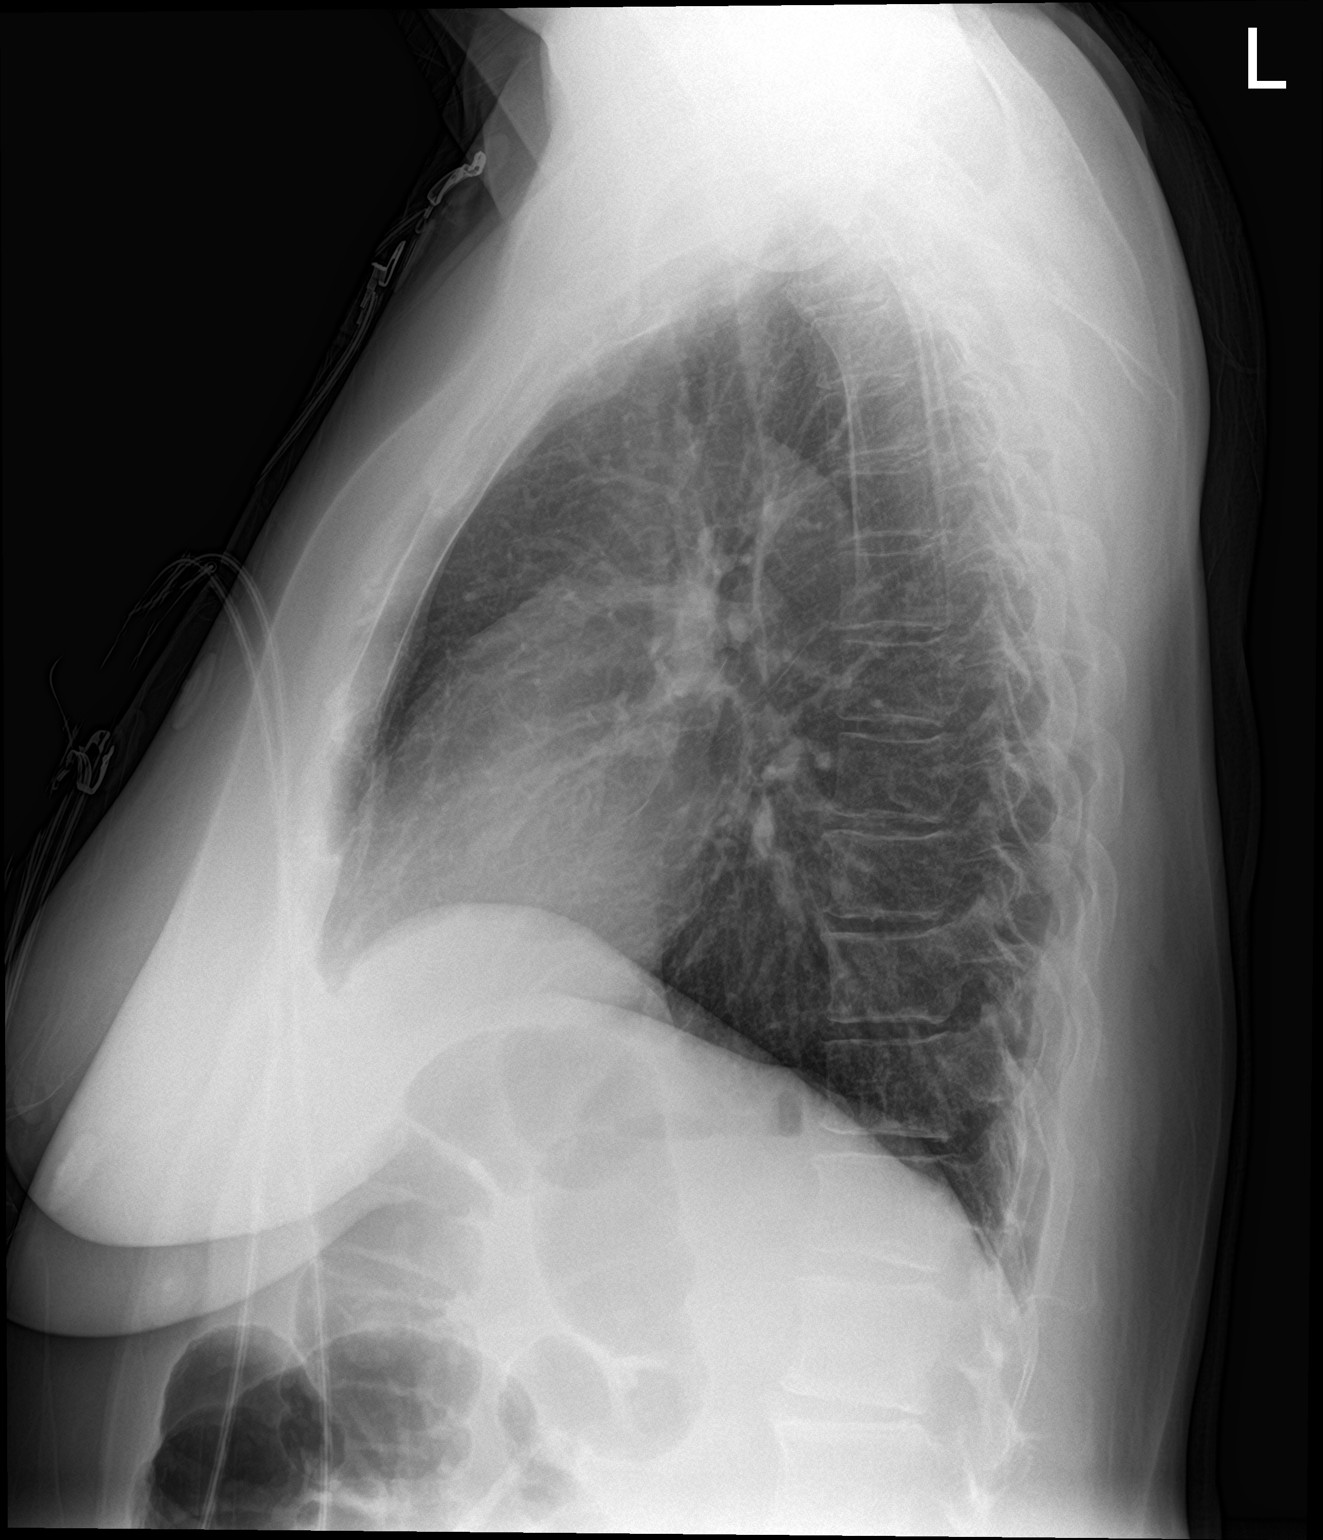

[2 of 2 positions shown; findings below may reference images not displayed]

FINDINGS: Normal heart size and mediastinal contours. No acute infiltrate or
edema. No effusion or pneumothorax. No acute osseous findings.

Artifact from EKG leads.
IMPRESSION: Negative chest.

## 2021-10-01 DIAGNOSIS — Z20822 Contact with and (suspected) exposure to covid-19: Secondary | ICD-10-CM | POA: Diagnosis not present

## 2021-10-01 DIAGNOSIS — R197 Diarrhea, unspecified: Secondary | ICD-10-CM | POA: Diagnosis not present

## 2021-10-01 DIAGNOSIS — R509 Fever, unspecified: Secondary | ICD-10-CM | POA: Diagnosis not present

## 2021-10-01 DIAGNOSIS — R519 Headache, unspecified: Secondary | ICD-10-CM | POA: Diagnosis not present

## 2021-10-01 DIAGNOSIS — J069 Acute upper respiratory infection, unspecified: Secondary | ICD-10-CM | POA: Diagnosis not present

## 2021-10-01 DIAGNOSIS — U071 COVID-19: Secondary | ICD-10-CM | POA: Diagnosis not present

## 2021-10-03 DIAGNOSIS — U071 COVID-19: Secondary | ICD-10-CM | POA: Diagnosis not present

## 2021-10-25 DIAGNOSIS — I10 Essential (primary) hypertension: Secondary | ICD-10-CM | POA: Diagnosis not present

## 2021-10-25 DIAGNOSIS — M961 Postlaminectomy syndrome, not elsewhere classified: Secondary | ICD-10-CM | POA: Diagnosis not present

## 2021-10-25 DIAGNOSIS — R42 Dizziness and giddiness: Secondary | ICD-10-CM | POA: Diagnosis not present

## 2021-10-25 DIAGNOSIS — R21 Rash and other nonspecific skin eruption: Secondary | ICD-10-CM | POA: Diagnosis not present

## 2021-11-29 ENCOUNTER — Other Ambulatory Visit: Payer: Self-pay | Admitting: Family Medicine

## 2021-11-29 DIAGNOSIS — Z1231 Encounter for screening mammogram for malignant neoplasm of breast: Secondary | ICD-10-CM

## 2021-12-01 DIAGNOSIS — E1169 Type 2 diabetes mellitus with other specified complication: Secondary | ICD-10-CM | POA: Diagnosis not present

## 2021-12-01 DIAGNOSIS — K5900 Constipation, unspecified: Secondary | ICD-10-CM | POA: Diagnosis not present

## 2021-12-01 DIAGNOSIS — I10 Essential (primary) hypertension: Secondary | ICD-10-CM | POA: Diagnosis not present

## 2021-12-06 DIAGNOSIS — N644 Mastodynia: Secondary | ICD-10-CM | POA: Diagnosis not present

## 2021-12-13 ENCOUNTER — Ambulatory Visit: Payer: Medicare Other

## 2022-02-28 DIAGNOSIS — I10 Essential (primary) hypertension: Secondary | ICD-10-CM | POA: Diagnosis not present

## 2022-02-28 DIAGNOSIS — E1169 Type 2 diabetes mellitus with other specified complication: Secondary | ICD-10-CM | POA: Diagnosis not present

## 2022-02-28 DIAGNOSIS — E7800 Pure hypercholesterolemia, unspecified: Secondary | ICD-10-CM | POA: Diagnosis not present

## 2022-02-28 DIAGNOSIS — K5900 Constipation, unspecified: Secondary | ICD-10-CM | POA: Diagnosis not present

## 2022-08-06 DIAGNOSIS — M545 Low back pain, unspecified: Secondary | ICD-10-CM | POA: Diagnosis not present

## 2022-08-06 DIAGNOSIS — M79643 Pain in unspecified hand: Secondary | ICD-10-CM | POA: Diagnosis not present

## 2022-08-22 DIAGNOSIS — I10 Essential (primary) hypertension: Secondary | ICD-10-CM | POA: Diagnosis not present

## 2022-08-22 DIAGNOSIS — E1169 Type 2 diabetes mellitus with other specified complication: Secondary | ICD-10-CM | POA: Diagnosis not present

## 2022-08-22 DIAGNOSIS — Z79899 Other long term (current) drug therapy: Secondary | ICD-10-CM | POA: Diagnosis not present

## 2022-08-23 DIAGNOSIS — I270 Primary pulmonary hypertension: Secondary | ICD-10-CM | POA: Diagnosis not present

## 2022-08-23 DIAGNOSIS — K219 Gastro-esophageal reflux disease without esophagitis: Secondary | ICD-10-CM | POA: Diagnosis not present

## 2022-08-23 DIAGNOSIS — I2510 Atherosclerotic heart disease of native coronary artery without angina pectoris: Secondary | ICD-10-CM | POA: Diagnosis not present

## 2022-08-23 DIAGNOSIS — E1169 Type 2 diabetes mellitus with other specified complication: Secondary | ICD-10-CM | POA: Diagnosis not present

## 2022-08-23 DIAGNOSIS — M5126 Other intervertebral disc displacement, lumbar region: Secondary | ICD-10-CM | POA: Diagnosis not present

## 2022-08-23 DIAGNOSIS — M545 Low back pain, unspecified: Secondary | ICD-10-CM | POA: Diagnosis not present

## 2022-08-23 DIAGNOSIS — Z79899 Other long term (current) drug therapy: Secondary | ICD-10-CM | POA: Diagnosis not present

## 2022-08-23 DIAGNOSIS — Z0000 Encounter for general adult medical examination without abnormal findings: Secondary | ICD-10-CM | POA: Diagnosis not present

## 2022-08-23 DIAGNOSIS — E7800 Pure hypercholesterolemia, unspecified: Secondary | ICD-10-CM | POA: Diagnosis not present

## 2022-08-23 DIAGNOSIS — K589 Irritable bowel syndrome without diarrhea: Secondary | ICD-10-CM | POA: Diagnosis not present

## 2022-08-23 DIAGNOSIS — I10 Essential (primary) hypertension: Secondary | ICD-10-CM | POA: Diagnosis not present

## 2022-09-19 DIAGNOSIS — Z780 Asymptomatic menopausal state: Secondary | ICD-10-CM | POA: Diagnosis not present

## 2022-09-19 DIAGNOSIS — M8589 Other specified disorders of bone density and structure, multiple sites: Secondary | ICD-10-CM | POA: Diagnosis not present

## 2022-09-19 DIAGNOSIS — M5416 Radiculopathy, lumbar region: Secondary | ICD-10-CM | POA: Diagnosis not present

## 2022-10-09 DIAGNOSIS — M5416 Radiculopathy, lumbar region: Secondary | ICD-10-CM | POA: Diagnosis not present

## 2022-10-17 DIAGNOSIS — G5603 Carpal tunnel syndrome, bilateral upper limbs: Secondary | ICD-10-CM | POA: Diagnosis not present

## 2022-11-06 DIAGNOSIS — R051 Acute cough: Secondary | ICD-10-CM | POA: Diagnosis not present

## 2022-11-06 DIAGNOSIS — R509 Fever, unspecified: Secondary | ICD-10-CM | POA: Diagnosis not present

## 2022-11-06 DIAGNOSIS — U071 COVID-19: Secondary | ICD-10-CM | POA: Diagnosis not present

## 2022-11-06 DIAGNOSIS — R52 Pain, unspecified: Secondary | ICD-10-CM | POA: Diagnosis not present

## 2023-08-23 ENCOUNTER — Telehealth (INDEPENDENT_AMBULATORY_CARE_PROVIDER_SITE_OTHER): Payer: Self-pay

## 2023-08-23 ENCOUNTER — Other Ambulatory Visit (INDEPENDENT_AMBULATORY_CARE_PROVIDER_SITE_OTHER): Payer: Self-pay

## 2023-09-04 ENCOUNTER — Other Ambulatory Visit (HOSPITAL_COMMUNITY)
Admission: RE | Admit: 2023-09-04 | Discharge: 2023-09-04 | Disposition: A | Discharge status: Home / Self Care | Payer: 59 | Source: Ambulatory Visit | Attending: Family Medicine | Admitting: Family Medicine

## 2023-09-04 ENCOUNTER — Other Ambulatory Visit: Payer: Self-pay | Admitting: Family Medicine

## 2023-09-04 DIAGNOSIS — Z0000 Encounter for general adult medical examination without abnormal findings: Secondary | ICD-10-CM | POA: Diagnosis not present

## 2023-09-04 DIAGNOSIS — Z1151 Encounter for screening for human papillomavirus (HPV): Secondary | ICD-10-CM | POA: Diagnosis not present

## 2023-09-04 DIAGNOSIS — I10 Essential (primary) hypertension: Secondary | ICD-10-CM | POA: Diagnosis not present

## 2023-09-04 DIAGNOSIS — Z79899 Other long term (current) drug therapy: Secondary | ICD-10-CM | POA: Diagnosis not present

## 2023-09-04 DIAGNOSIS — I270 Primary pulmonary hypertension: Secondary | ICD-10-CM | POA: Diagnosis not present

## 2023-09-04 DIAGNOSIS — E119 Type 2 diabetes mellitus without complications: Secondary | ICD-10-CM | POA: Diagnosis not present

## 2023-09-04 DIAGNOSIS — E1169 Type 2 diabetes mellitus with other specified complication: Secondary | ICD-10-CM | POA: Diagnosis not present

## 2023-09-04 DIAGNOSIS — Z01411 Encounter for gynecological examination (general) (routine) with abnormal findings: Secondary | ICD-10-CM | POA: Insufficient documentation

## 2023-09-04 DIAGNOSIS — K649 Unspecified hemorrhoids: Secondary | ICD-10-CM | POA: Diagnosis not present

## 2023-09-04 DIAGNOSIS — E559 Vitamin D deficiency, unspecified: Secondary | ICD-10-CM | POA: Diagnosis not present

## 2023-09-10 LAB — CYTOLOGY - PAP
Comment: NEGATIVE
Diagnosis: NEGATIVE
High risk HPV: NEGATIVE

## 2023-10-15 DIAGNOSIS — M654 Radial styloid tenosynovitis [de Quervain]: Secondary | ICD-10-CM | POA: Diagnosis not present

## 2024-02-07 DIAGNOSIS — J069 Acute upper respiratory infection, unspecified: Secondary | ICD-10-CM | POA: Diagnosis not present

## 2024-02-07 DIAGNOSIS — E1169 Type 2 diabetes mellitus with other specified complication: Secondary | ICD-10-CM | POA: Diagnosis not present

## 2024-02-07 DIAGNOSIS — I10 Essential (primary) hypertension: Secondary | ICD-10-CM | POA: Diagnosis not present

## 2024-02-07 DIAGNOSIS — R2242 Localized swelling, mass and lump, left lower limb: Secondary | ICD-10-CM | POA: Diagnosis not present

## 2024-09-14 ENCOUNTER — Encounter (HOSPITAL_BASED_OUTPATIENT_CLINIC_OR_DEPARTMENT_OTHER): Payer: Self-pay

## 2024-09-14 ENCOUNTER — Other Ambulatory Visit: Payer: Self-pay

## 2024-09-14 ENCOUNTER — Emergency Department (HOSPITAL_BASED_OUTPATIENT_CLINIC_OR_DEPARTMENT_OTHER)
Admission: EM | Admit: 2024-09-14 | Discharge: 2024-09-14 | Discharge status: Against Medical Advice | Payer: Medicare (Managed Care) | Attending: Emergency Medicine | Admitting: Emergency Medicine

## 2024-09-14 DIAGNOSIS — M545 Low back pain, unspecified: Secondary | ICD-10-CM | POA: Insufficient documentation

## 2024-09-14 DIAGNOSIS — Z5321 Procedure and treatment not carried out due to patient leaving prior to being seen by health care provider: Secondary | ICD-10-CM | POA: Insufficient documentation

## 2024-09-14 NOTE — ED Notes (Signed)
Pt called to be roomed with no answer.  

## 2024-09-14 NOTE — ED Notes (Signed)
Pt called to be roomed

## 2024-09-14 NOTE — ED Triage Notes (Signed)
 Patient reports right sided lower back pain that has been occurring for a few days now but is intensifying. She says she has no history of kidney stones and has no urinary symptoms. Said she had back problems in the past requiring injections.

## 2024-09-14 NOTE — ED Notes (Signed)
No answer 3 rd time.
